# Patient Record
Sex: Female | Born: 1945 | Race: White | Hispanic: No | Marital: Married | Smoking: Never smoker
Health system: Southern US, Community
[De-identification: ages and names within clinical notes are randomized; demographics above are authoritative.]

## PROBLEM LIST (undated history)

## (undated) DIAGNOSIS — K5790 Diverticulosis of intestine, part unspecified, without perforation or abscess without bleeding: Secondary | ICD-10-CM

## (undated) DIAGNOSIS — F329 Major depressive disorder, single episode, unspecified: Secondary | ICD-10-CM

## (undated) DIAGNOSIS — K648 Other hemorrhoids: Secondary | ICD-10-CM

## (undated) DIAGNOSIS — F32A Depression, unspecified: Secondary | ICD-10-CM

## (undated) DIAGNOSIS — M1990 Unspecified osteoarthritis, unspecified site: Secondary | ICD-10-CM

## (undated) DIAGNOSIS — K635 Polyp of colon: Secondary | ICD-10-CM

## (undated) HISTORY — DX: Polyp of colon: K63.5

## (undated) HISTORY — PX: EYE SURGERY: SHX253

## (undated) HISTORY — DX: Other hemorrhoids: K64.8

## (undated) HISTORY — DX: Depression, unspecified: F32.A

## (undated) HISTORY — DX: Major depressive disorder, single episode, unspecified: F32.9

## (undated) HISTORY — DX: Unspecified osteoarthritis, unspecified site: M19.90

## (undated) HISTORY — DX: Diverticulosis of intestine, part unspecified, without perforation or abscess without bleeding: K57.90

## (undated) HISTORY — PX: BACK SURGERY: SHX140

---

## 1997-09-27 HISTORY — PX: TOTAL ABDOMINAL HYSTERECTOMY W/ BILATERAL SALPINGOOPHORECTOMY: SHX83

## 1997-10-17 ENCOUNTER — Inpatient Hospital Stay (HOSPITAL_COMMUNITY): Admission: RE | Admit: 1997-10-17 | Discharge: 1997-10-19 | Payer: Self-pay | Admitting: Gynecology

## 1999-01-17 ENCOUNTER — Encounter: Admission: RE | Admit: 1999-01-17 | Discharge: 1999-01-17 | Payer: Self-pay | Admitting: Gynecology

## 1999-01-17 ENCOUNTER — Encounter: Payer: Self-pay | Admitting: Gynecology

## 2000-01-24 ENCOUNTER — Encounter: Payer: Self-pay | Admitting: Gynecology

## 2000-01-24 ENCOUNTER — Encounter: Admission: RE | Admit: 2000-01-24 | Discharge: 2000-01-24 | Payer: Self-pay | Admitting: Gynecology

## 2000-08-24 ENCOUNTER — Other Ambulatory Visit: Admission: RE | Admit: 2000-08-24 | Discharge: 2000-08-24 | Payer: Self-pay | Admitting: Gynecology

## 2000-12-20 ENCOUNTER — Encounter: Payer: Self-pay | Admitting: Internal Medicine

## 2000-12-20 ENCOUNTER — Encounter: Admission: RE | Admit: 2000-12-20 | Discharge: 2000-12-20 | Payer: Self-pay | Admitting: Internal Medicine

## 2001-01-26 ENCOUNTER — Encounter: Admission: RE | Admit: 2001-01-26 | Discharge: 2001-01-26 | Payer: Self-pay | Admitting: Gynecology

## 2001-01-26 ENCOUNTER — Encounter: Payer: Self-pay | Admitting: Gynecology

## 2001-09-21 ENCOUNTER — Other Ambulatory Visit: Admission: RE | Admit: 2001-09-21 | Discharge: 2001-09-21 | Payer: Self-pay | Admitting: Gynecology

## 2002-01-31 ENCOUNTER — Encounter: Admission: RE | Admit: 2002-01-31 | Discharge: 2002-01-31 | Payer: Self-pay | Admitting: Gynecology

## 2002-01-31 ENCOUNTER — Encounter: Payer: Self-pay | Admitting: Gynecology

## 2002-06-01 ENCOUNTER — Encounter: Payer: Self-pay | Admitting: Internal Medicine

## 2002-09-26 ENCOUNTER — Other Ambulatory Visit: Admission: RE | Admit: 2002-09-26 | Discharge: 2002-09-26 | Payer: Self-pay | Admitting: Gynecology

## 2003-02-24 ENCOUNTER — Encounter: Admission: RE | Admit: 2003-02-24 | Discharge: 2003-02-24 | Payer: Self-pay | Admitting: Gynecology

## 2003-09-28 ENCOUNTER — Other Ambulatory Visit: Admission: RE | Admit: 2003-09-28 | Discharge: 2003-09-28 | Payer: Self-pay | Admitting: Gynecology

## 2003-11-20 ENCOUNTER — Encounter: Payer: Self-pay | Admitting: Internal Medicine

## 2004-02-29 ENCOUNTER — Encounter: Admission: RE | Admit: 2004-02-29 | Discharge: 2004-02-29 | Payer: Self-pay | Admitting: Gynecology

## 2004-05-29 ENCOUNTER — Ambulatory Visit: Payer: Self-pay | Admitting: Internal Medicine

## 2004-07-24 ENCOUNTER — Ambulatory Visit: Payer: Self-pay | Admitting: Internal Medicine

## 2004-07-29 ENCOUNTER — Encounter: Admission: RE | Admit: 2004-07-29 | Discharge: 2004-07-29 | Payer: Self-pay | Admitting: Internal Medicine

## 2004-10-07 ENCOUNTER — Other Ambulatory Visit: Admission: RE | Admit: 2004-10-07 | Discharge: 2004-10-07 | Payer: Self-pay | Admitting: Gynecology

## 2004-10-14 ENCOUNTER — Ambulatory Visit: Payer: Self-pay | Admitting: Internal Medicine

## 2004-10-21 ENCOUNTER — Ambulatory Visit: Payer: Self-pay | Admitting: Internal Medicine

## 2004-11-05 ENCOUNTER — Ambulatory Visit: Payer: Self-pay | Admitting: Internal Medicine

## 2005-03-11 ENCOUNTER — Encounter: Admission: RE | Admit: 2005-03-11 | Discharge: 2005-03-11 | Payer: Self-pay | Admitting: Internal Medicine

## 2005-05-26 ENCOUNTER — Encounter: Admission: RE | Admit: 2005-05-26 | Discharge: 2005-05-26 | Payer: Self-pay | Admitting: Neurological Surgery

## 2005-06-13 ENCOUNTER — Inpatient Hospital Stay (HOSPITAL_COMMUNITY): Admission: RE | Admit: 2005-06-13 | Discharge: 2005-06-16 | Payer: Self-pay | Admitting: Neurological Surgery

## 2005-07-22 ENCOUNTER — Encounter: Admission: RE | Admit: 2005-07-22 | Discharge: 2005-07-22 | Payer: Self-pay | Admitting: Neurological Surgery

## 2005-09-22 ENCOUNTER — Encounter: Admission: RE | Admit: 2005-09-22 | Discharge: 2005-09-22 | Payer: Self-pay | Admitting: Neurological Surgery

## 2005-10-30 ENCOUNTER — Other Ambulatory Visit: Admission: RE | Admit: 2005-10-30 | Discharge: 2005-10-30 | Payer: Self-pay | Admitting: Gynecology

## 2005-12-15 ENCOUNTER — Ambulatory Visit: Payer: Self-pay | Admitting: Internal Medicine

## 2005-12-15 DIAGNOSIS — F3289 Other specified depressive episodes: Secondary | ICD-10-CM | POA: Insufficient documentation

## 2005-12-15 DIAGNOSIS — M19049 Primary osteoarthritis, unspecified hand: Secondary | ICD-10-CM | POA: Insufficient documentation

## 2005-12-15 DIAGNOSIS — E785 Hyperlipidemia, unspecified: Secondary | ICD-10-CM

## 2005-12-15 DIAGNOSIS — F329 Major depressive disorder, single episode, unspecified: Secondary | ICD-10-CM

## 2005-12-15 LAB — CONVERTED CEMR LAB
ALT: 22 units/L (ref 0–40)
AST: 23 units/L (ref 0–37)
Albumin: 4 g/dL (ref 3.5–5.2)
Alkaline Phosphatase: 59 units/L (ref 39–117)
BUN: 13 mg/dL (ref 6–23)
Basophils Absolute: 0 10*3/uL (ref 0.0–0.1)
Basophils Relative: 0.5 % (ref 0.0–1.0)
Bilirubin, Direct: 0.1 mg/dL (ref 0.0–0.3)
CO2: 30 meq/L (ref 19–32)
Calcium: 9.6 mg/dL (ref 8.4–10.5)
Chloride: 104 meq/L (ref 96–112)
Chol/HDL Ratio, serum: 2.3
Cholesterol: 198 mg/dL (ref 0–200)
Creatinine, Ser: 0.9 mg/dL (ref 0.4–1.2)
Eosinophil percent: 5.8 % — ABNORMAL HIGH (ref 0.0–5.0)
GFR calc non Af Amer: 69 mL/min
Glomerular Filtration Rate, Af Am: 83 mL/min/{1.73_m2}
Glucose, Bld: 84 mg/dL (ref 70–99)
HCT: 42 % (ref 36.0–46.0)
HDL: 84.4 mg/dL (ref >39.0)
Hemoglobin: 14.2 g/dL (ref 12.0–15.0)
LDL Cholesterol: 105 mg/dL — ABNORMAL HIGH (ref 0–99)
Lymphocytes Relative: 36.6 % (ref 12.0–46.0)
MCHC: 33.8 g/dL (ref 30.0–36.0)
MCV: 92.5 fL (ref 78.0–100.0)
Monocytes Absolute: 0.7 10*3/uL (ref 0.2–0.7)
Monocytes Relative: 15.1 % — ABNORMAL HIGH (ref 3.0–11.0)
Neutro Abs: 2 10*3/uL (ref 1.4–7.7)
Neutrophils Relative %: 42 % — ABNORMAL LOW (ref 43.0–77.0)
Platelets: 257 10*3/uL (ref 150–400)
Potassium: 4.3 meq/L (ref 3.5–5.1)
RBC: 4.54 M/uL (ref 3.87–5.11)
RDW: 12.7 % (ref 11.5–14.6)
Sodium: 143 meq/L (ref 135–145)
Total Bilirubin: 0.6 mg/dL (ref 0.3–1.2)
Total Protein: 7 g/dL (ref 6.0–8.3)
Triglyceride fasting, serum: 41 mg/dL (ref 0–149)
VLDL: 8 mg/dL (ref 0–40)
WBC: 4.7 10*3/uL (ref 4.5–10.5)

## 2005-12-22 ENCOUNTER — Encounter: Admission: RE | Admit: 2005-12-22 | Discharge: 2005-12-22 | Payer: Self-pay | Admitting: Neurological Surgery

## 2006-04-03 ENCOUNTER — Encounter: Payer: Self-pay | Admitting: Internal Medicine

## 2006-04-03 ENCOUNTER — Encounter: Admission: RE | Admit: 2006-04-03 | Discharge: 2006-04-03 | Payer: Self-pay | Admitting: Gynecology

## 2006-05-21 ENCOUNTER — Ambulatory Visit: Payer: Self-pay | Admitting: Internal Medicine

## 2006-10-30 DIAGNOSIS — M1990 Unspecified osteoarthritis, unspecified site: Secondary | ICD-10-CM | POA: Insufficient documentation

## 2007-05-06 ENCOUNTER — Encounter: Admission: RE | Admit: 2007-05-06 | Discharge: 2007-05-06 | Payer: Self-pay | Admitting: Gynecology

## 2007-06-01 ENCOUNTER — Telehealth: Payer: Self-pay | Admitting: Internal Medicine

## 2007-06-01 ENCOUNTER — Ambulatory Visit: Payer: Self-pay | Admitting: Internal Medicine

## 2007-09-30 ENCOUNTER — Ambulatory Visit: Payer: Self-pay | Admitting: Internal Medicine

## 2007-09-30 LAB — CONVERTED CEMR LAB
AST: 24 units/L (ref 0–37)
Albumin: 3.8 g/dL (ref 3.5–5.2)
Alkaline Phosphatase: 58 units/L (ref 39–117)
BUN: 12 mg/dL (ref 6–23)
Bilirubin, Direct: 0.1 mg/dL (ref 0.0–0.3)
Blood in Urine, dipstick: NEGATIVE
CO2: 31 meq/L (ref 19–32)
Chloride: 104 meq/L (ref 96–112)
Eosinophils Relative: 3.2 % (ref 0.0–5.0)
Glucose, Bld: 89 mg/dL (ref 70–99)
HCT: 39.3 % (ref 36.0–46.0)
HDL: 81.7 mg/dL (ref >39.0)
LDL Cholesterol: 99 mg/dL (ref 0–99)
Lymphocytes Relative: 35.1 % (ref 12.0–46.0)
Monocytes Absolute: 0.6 10*3/uL (ref 0.1–1.0)
Monocytes Relative: 13.4 % — ABNORMAL HIGH (ref 3.0–12.0)
Neutrophils Relative %: 47.2 % (ref 43.0–77.0)
Nitrite: NEGATIVE
Platelets: 252 10*3/uL (ref 150–400)
Potassium: 3.7 meq/L (ref 3.5–5.1)
Specific Gravity, Urine: 1.01
Total CHOL/HDL Ratio: 2.3
Total Protein: 7 g/dL (ref 6.0–8.3)
Urobilinogen, UA: 0.2
WBC Urine, dipstick: NEGATIVE
WBC: 4.7 10*3/uL (ref 4.5–10.5)

## 2007-10-08 ENCOUNTER — Telehealth: Payer: Self-pay | Admitting: Internal Medicine

## 2007-11-18 ENCOUNTER — Ambulatory Visit: Payer: Self-pay | Admitting: Internal Medicine

## 2007-11-18 LAB — HM COLONOSCOPY

## 2007-11-19 ENCOUNTER — Telehealth: Payer: Self-pay | Admitting: Internal Medicine

## 2008-06-09 ENCOUNTER — Encounter: Admission: RE | Admit: 2008-06-09 | Discharge: 2008-06-09 | Payer: Self-pay | Admitting: Internal Medicine

## 2008-11-24 ENCOUNTER — Telehealth: Payer: Self-pay | Admitting: Internal Medicine

## 2008-12-28 ENCOUNTER — Ambulatory Visit: Payer: Self-pay | Admitting: Internal Medicine

## 2008-12-28 LAB — CONVERTED CEMR LAB
ALT: 22 units/L (ref 0–35)
AST: 23 units/L (ref 0–37)
Alkaline Phosphatase: 58 units/L (ref 39–117)
BUN: 11 mg/dL (ref 6–23)
Basophils Relative: 1.2 % (ref 0.0–3.0)
Bilirubin, Direct: 0.1 mg/dL (ref 0.0–0.3)
Chloride: 104 meq/L (ref 96–112)
Cholesterol: 218 mg/dL — ABNORMAL HIGH (ref 0–200)
Creatinine, Ser: 0.8 mg/dL (ref 0.4–1.2)
Eosinophils Relative: 4.4 % (ref 0.0–5.0)
GFR calc non Af Amer: 77.51 mL/min (ref >60)
HDL: 87.6 mg/dL (ref >39.00)
MCV: 95.8 fL (ref 78.0–100.0)
Monocytes Absolute: 0.6 10*3/uL (ref 0.1–1.0)
Monocytes Relative: 12.4 % — ABNORMAL HIGH (ref 3.0–12.0)
Neutrophils Relative %: 44.5 % (ref 43.0–77.0)
Platelets: 226 10*3/uL (ref 150.0–400.0)
Potassium: 4 meq/L (ref 3.5–5.1)
Protein, U semiquant: NEGATIVE
RBC: 4.51 M/uL (ref 3.87–5.11)
Total Bilirubin: 0.8 mg/dL (ref 0.3–1.2)
Total CHOL/HDL Ratio: 2
Total Protein: 7.7 g/dL (ref 6.0–8.3)
Urobilinogen, UA: 0.2
VLDL: 17.4 mg/dL (ref 0.0–40.0)
WBC Urine, dipstick: NEGATIVE
WBC: 4.9 10*3/uL (ref 4.5–10.5)

## 2008-12-29 ENCOUNTER — Telehealth: Payer: Self-pay | Admitting: Internal Medicine

## 2009-01-03 ENCOUNTER — Ambulatory Visit: Payer: Self-pay | Admitting: Internal Medicine

## 2009-07-03 ENCOUNTER — Encounter: Admission: RE | Admit: 2009-07-03 | Discharge: 2009-07-03 | Payer: Self-pay | Admitting: Gynecology

## 2009-07-03 LAB — HM MAMMOGRAPHY

## 2009-11-27 ENCOUNTER — Encounter: Payer: Self-pay | Admitting: Internal Medicine

## 2009-12-26 ENCOUNTER — Encounter: Admission: RE | Admit: 2009-12-26 | Discharge: 2009-12-26 | Payer: Self-pay | Admitting: Neurological Surgery

## 2010-01-02 ENCOUNTER — Ambulatory Visit: Payer: Self-pay | Admitting: Internal Medicine

## 2010-01-02 LAB — CONVERTED CEMR LAB
ALT: 24 units/L (ref 0–35)
AST: 26 units/L (ref 0–37)
Alkaline Phosphatase: 58 units/L (ref 39–117)
Basophils Absolute: 0 10*3/uL (ref 0.0–0.1)
Blood in Urine, dipstick: NEGATIVE
Calcium: 9.2 mg/dL (ref 8.4–10.5)
Eosinophils Relative: 5.1 % — ABNORMAL HIGH (ref 0.0–5.0)
GFR calc non Af Amer: 91.63 mL/min (ref >60.00)
Glucose, Bld: 83 mg/dL (ref 70–99)
HDL: 89.7 mg/dL (ref >39.00)
Hemoglobin: 12.9 g/dL (ref 12.0–15.0)
Ketones, urine, test strip: NEGATIVE
Lymphocytes Relative: 33 % (ref 12.0–46.0)
Monocytes Relative: 11.5 % (ref 3.0–12.0)
Neutro Abs: 2.5 10*3/uL (ref 1.4–7.7)
Nitrite: NEGATIVE
Platelets: 231 10*3/uL (ref 150.0–400.0)
Potassium: 4.2 meq/L (ref 3.5–5.1)
Protein, U semiquant: NEGATIVE
RDW: 13.8 % (ref 11.5–14.6)
Sodium: 140 meq/L (ref 135–145)
Specific Gravity, Urine: 1.015
Total Bilirubin: 0.5 mg/dL (ref 0.3–1.2)
Total CHOL/HDL Ratio: 2
Triglycerides: 45 mg/dL (ref 0.0–149.0)
Urobilinogen, UA: 0.2
VLDL: 9 mg/dL (ref 0.0–40.0)
WBC: 5.1 10*3/uL (ref 4.5–10.5)

## 2010-01-16 ENCOUNTER — Ambulatory Visit: Payer: Self-pay | Admitting: Internal Medicine

## 2010-01-16 ENCOUNTER — Encounter: Payer: Self-pay | Admitting: Internal Medicine

## 2010-01-27 DIAGNOSIS — K635 Polyp of colon: Secondary | ICD-10-CM

## 2010-01-27 DIAGNOSIS — K648 Other hemorrhoids: Secondary | ICD-10-CM

## 2010-01-27 DIAGNOSIS — K5790 Diverticulosis of intestine, part unspecified, without perforation or abscess without bleeding: Secondary | ICD-10-CM

## 2010-01-27 HISTORY — DX: Polyp of colon: K63.5

## 2010-01-27 HISTORY — DX: Diverticulosis of intestine, part unspecified, without perforation or abscess without bleeding: K57.90

## 2010-01-27 HISTORY — DX: Other hemorrhoids: K64.8

## 2010-02-13 LAB — CBC
HCT: 41.7 % (ref 36.0–46.0)
Hemoglobin: 13.6 g/dL (ref 12.0–15.0)
MCH: 29.5 pg (ref 26.0–34.0)
MCHC: 32.6 g/dL (ref 30.0–36.0)
MCV: 90.5 fL (ref 78.0–100.0)
Platelets: 258 10*3/uL (ref 150–400)
RBC: 4.61 MIL/uL (ref 3.87–5.11)
RDW: 13.3 % (ref 11.5–15.5)
WBC: 6.2 10*3/uL (ref 4.0–10.5)

## 2010-02-13 LAB — DIFFERENTIAL
Basophils Absolute: 0.1 10*3/uL (ref 0.0–0.1)
Basophils Relative: 1 % (ref 0–1)
Eosinophils Absolute: 0.3 10*3/uL (ref 0.0–0.7)
Eosinophils Relative: 5 % (ref 0–5)
Lymphocytes Relative: 39 % (ref 12–46)
Lymphs Abs: 2.4 10*3/uL (ref 0.7–4.0)
Monocytes Absolute: 0.8 10*3/uL (ref 0.1–1.0)
Monocytes Relative: 13 % — ABNORMAL HIGH (ref 3–12)
Neutro Abs: 2.6 10*3/uL (ref 1.7–7.7)
Neutrophils Relative %: 42 % — ABNORMAL LOW (ref 43–77)

## 2010-02-13 LAB — SURGICAL PCR SCREEN
MRSA, PCR: NEGATIVE
Staphylococcus aureus: NEGATIVE

## 2010-02-13 LAB — BASIC METABOLIC PANEL
BUN: 13 mg/dL (ref 6–23)
CO2: 28 mEq/L (ref 19–32)
Calcium: 9.6 mg/dL (ref 8.4–10.5)
Chloride: 105 mEq/L (ref 96–112)
Creatinine, Ser: 0.81 mg/dL (ref 0.4–1.2)
GFR calc Af Amer: >60 mL/min (ref >60)
GFR calc non Af Amer: >60 mL/min (ref >60)
Glucose, Bld: 89 mg/dL (ref 70–99)
Potassium: 4 mEq/L (ref 3.5–5.1)
Sodium: 141 mEq/L (ref 135–145)

## 2010-02-13 LAB — TYPE AND SCREEN
ABO/RH(D): A POS
Antibody Screen: NEGATIVE

## 2010-02-13 LAB — PROTIME-INR
INR: 0.99 (ref 0.00–1.49)
Prothrombin Time: 13.3 seconds (ref 11.6–15.2)

## 2010-02-13 LAB — APTT: aPTT: 34 seconds (ref 24–37)

## 2010-02-14 ENCOUNTER — Inpatient Hospital Stay (HOSPITAL_COMMUNITY)
Admission: RE | Admit: 2010-02-14 | Discharge: 2010-02-17 | Payer: Self-pay | Source: Home / Self Care | Attending: Neurological Surgery | Admitting: Neurological Surgery

## 2010-02-22 NOTE — Op Note (Addendum)
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  PROCEDURES: 1. Lumbar re-exploration with exploration of fusion at L4-5, removal     of hardware at L4-5. 2. Decompressive lumbar laminectomy, hemifacetectomy, and foraminotomy     at L3-4 in a Gill type fashion requiring much more work than is     required of a simple plus procedure. 3. Intertransverse arthrodesis at L3-4 utilizing local autograft and     Actifuse putty. 4. Posterior lumbar interbody fusion at L3-4 utilizing a 10 x 8 mm     Tangent interbody bone wedge and then 8-mm PEEK interbody cage     packed with local autograft and Actifuse putty. 5. Nonsegmental fixation at L3-4 utilizing the Legacy pedicle screw     system.  SURGEON:  Tia Alert, MD  ASSISTANT:  Reinaldo Meeker, MD  ANESTHESIA:  General endotracheal.  COMPLICATIONS:  None apparent.  INDICATION FOR THE PROCEDURE:  Maria Lloyd is a 65 year old female who underwent posterior lumbar interbody fusion at L4-5 several years ago and did quite well.  She returned with back and bilateral leg pain.  She had an MRI and plain films which showed a solid fusion at L3-4 but significant degenerative breakdown at L3-4 with spondylolisthesis, kyphosis, and severe stenosis.  I recommended a lumbar re-exploration with a posterior lumbar interbody fusion at L3-4.  She understood  the risks, benefits, and expected outcome and wished to proceed.  DESCRIPTION OF THE PROCEDURE:  The patient was taken to operating room. After induction of adequate generalized endotracheal anesthesia, she was rolled in the prone position on the chest rolls and all pressure points were padded.  Her lumbar region was prepped with DuraPrep and then draped in usual sterile fashion.  A 10 mL of local anesthesia was injected and a dorsal midline incision made and carried down to the lumbosacral fascia.  The fascia was opened, the paraspinous musculature was taken down in a subperiosteal fashion to expose L3-4 and L4-5, exposed the transverse processes of L3 and L4.  I exposed the old pedicle screws and locking caps and rods at L4-5.  I removed the locking caps and the rods and then removed the L5 pedicle screws.  The L4 pedicle screws were tested and felt to be solid in the bone.  I then removed the spinous process of L3 and then utilizing the high-speed drill and Kerrison punches, I performed a complete laminectomy, hemifacetectomy, and foraminotomy at L3-4.  The underlying yellow ligament was opened and removed.  The L3 and L4 nerve  roots were decompressed distally into their respective foramina.  Wide foraminotomies were performed.  It was decompressed until I could pass coronary dilator easily along the L3 and L4 nerve roots.  I then found the pedicle screw entry zones at L3 utilizing AP and lateral fluoroscopy, probed each pedicle with a pedicle probe, tapped each pedicle with a 5-5 tap, and placed 65 x 45 mm pedicle screws into the L3 pedicles bilaterally.  I then coagulated the epidural venous vasculature and cut the disk space sharply and distracted the disk space up to 8 mm and then used rotating cutter and cutting chisel bilaterally to prepare the endplates.  The midline was prepared with an Epstein curette.  We then placed an 8-mm PEEK interbody cage packed with local autograft  and Actifuse putty on the left and a Tangent interbody bone wedge on the right.  The midline was packed with local autograft and Actifuse putty. The intertransverse space was decorticated and local autograft and Actifuse putty was placed here to perform intertransverse arthrodesis bilaterally.  I then placed lordotic rods into multiaxial screw heads of the pedicle screws and locked these into position with the locking caps and anti-torque device.  I then irrigated with saline solution containing bacitracin, dried all bleeding points, inspected nerve roots once again, lined the dura with Gelfoam, and then placed a medium Hemovac drain through a separate stab incision and then closed the muscle and the fascia with 0 Vicryl, closed the subcutaneous with subcuticular tissue with 2-0 and 3-0 Vicryl and closed the skin with Benzoin and  Steri-Strips.  The drapes were removed.  Sterile dressing was applied.  The patient was awakened from general anesthesia and transferred to recovery room in stable condition.  At the end of the procedure, all sponge, needle, and instrument counts were correct.     Tia Alert, MD     DSJ/MEDQ  D:  02/14/2010  T:  02/15/2010  Job:  [id number]  Electronically Signed by Marikay Alar MD on 02/22/2010 08:41:30 AM

## 2010-02-28 NOTE — Assessment & Plan Note (Signed)
Summary: cpx/njr/pt rsc from bmp/cjr   Vital Signs:  Patient profile:   65 year old female Menstrual status:  postmenopausal Height:      63.5 inches Weight:      157 pounds BMI:     27.47 Temp:     98.0 degrees F oral Pulse rate:   76 / minute Pulse rhythm:   regular BP sitting:   114 / 88  (left arm) Cuff size:   regular  Vitals Entered By: Alfred Levins, CMA (January 16, 2010 8:17 AM) CC: cpx, no pap   CC:  cpx and no pap.  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 Take 1 Tablet By Mouth Twice A Day 2)  Budeprion Xl 300 Mg Tb24 (Bupropion Hcl) .... Take 1 Tablet By Mouth Once A Day 3)  Vivelle-Dot 0.0375 Mg/24hr  Pttw (Estradiol) .... Change Twice Weekly 4)  Estrace 0.1 Mg/gm Crea (Estradiol) .... Use As Directed  Allergies (verified): No Known Drug Allergies  Family History:  Mother deceased with stroke, lung CA Family History of Prostate CA 1st degree relative father Brother -multiple medical issues deceased age 64  Physical Exam  General:  Well-developed,well-nourished,in no acute distress; alert,appropriate and cooperative throughout examination Head:  normocephalic and atraumatic.   Eyes:  pupils equal and pupils round.   Neck:  No deformities, masses, or tenderness noted. Chest Wall:  No deformities, masses, or tenderness noted. Lungs:  Normal respiratory effort, chest expands symmetrically. Lungs are clear to auscultation, no crackles or wheezes. Heart:  Normal rate and regular rhythm. S1 and S2 normal without gallop, murmur, click, rub or other extra sounds. Abdomen:  Bowel sounds positive,abdomen soft and non-tender without masses,  organomegaly or hernias noted. Skin:  Intact without suspicious lesions or rashes Psych:  memory intact for recent and remote and normally interactive.     Impression & Recommendations:  Problem # 1:  PREVENTIVE HEALTH CARE (ICD-V70.0) health maint UTD she will consider zostavax  Problem # 2:  HYPERLIPIDEMIA NEC/NOS (ICD-272.4) see hdl Labs Reviewed: SGOT: 26 (01/02/2010)   SGPT: 24 (01/02/2010)   HDL:89.70 (01/02/2010), 87.60 (12/28/2008)  LDL:99 (09/30/2007), 105 (16/10/9602)  Chol:206 (01/02/2010), 218 (12/28/2008)  Trig:45.0 (01/02/2010), 87.0 (12/28/2008)  Complete Medication List: 1)  Etodolac 400 Mg Tabs (Etodolac) .... Take 1 tablet by mouth twice a day 2)  Budeprion Xl 300 Mg Tb24 (Bupropion hcl) .... Take 1 tablet by mouth once a day 3)  Vivelle-dot 0.0375 Mg/24hr Pttw (Estradiol) .... Change twice weekly 4)  Estrace 0.1 Mg/gm Crea (Estradiol) .... Use as directed   Orders Added: 1)  Est. Patient 40-64 years [99396]    Preventive Care Screening  Bone Density:    Date:  11/27/2009    Next Due:  11/2011    Results:  normal std dev  Mammogram:    Date:  01/27/2009    Next Due:  06/2010    Results:  normal   Pap Smear:    Date:  11/27/2009    Next Due:  11/2010    Results:  normal

## 2010-03-18 ENCOUNTER — Ambulatory Visit
Admission: RE | Admit: 2010-03-18 | Discharge: 2010-03-18 | Disposition: A | Discharge status: Home / Self Care | Payer: Managed Care, Other (non HMO) | Source: Ambulatory Visit | Attending: Neurological Surgery | Admitting: Neurological Surgery

## 2010-03-18 ENCOUNTER — Other Ambulatory Visit: Payer: Self-pay | Admitting: Neurological Surgery

## 2010-03-18 DIAGNOSIS — Z09 Encounter for follow-up examination after completed treatment for conditions other than malignant neoplasm: Secondary | ICD-10-CM

## 2010-05-14 ENCOUNTER — Telehealth: Payer: Self-pay | Admitting: *Deleted

## 2010-05-14 DIAGNOSIS — F329 Major depressive disorder, single episode, unspecified: Secondary | ICD-10-CM

## 2010-05-14 MED ORDER — BUPROPION HCL ER (XL) 300 MG PO TB24: 300.0000 mg | ORAL_TABLET | ORAL | Status: DC

## 2010-05-14 NOTE — Telephone Encounter (Signed)
Bupropion XL 300mg  sent to [facility]

## 2010-05-14 NOTE — Telephone Encounter (Signed)
rx sent in electronically 

## 2010-05-21 ENCOUNTER — Other Ambulatory Visit: Payer: Self-pay | Admitting: Neurological Surgery

## 2010-05-21 ENCOUNTER — Ambulatory Visit
Admission: RE | Admit: 2010-05-21 | Discharge: 2010-05-21 | Disposition: A | Discharge status: Home / Self Care | Payer: Managed Care, Other (non HMO) | Source: Ambulatory Visit | Attending: Neurological Surgery | Admitting: Neurological Surgery

## 2010-05-21 DIAGNOSIS — M48061 Spinal stenosis, lumbar region without neurogenic claudication: Secondary | ICD-10-CM

## 2010-05-21 DIAGNOSIS — M4310 Spondylolisthesis, site unspecified: Secondary | ICD-10-CM

## 2010-05-21 DIAGNOSIS — M963 Postlaminectomy kyphosis: Secondary | ICD-10-CM

## 2010-05-21 DIAGNOSIS — M79609 Pain in unspecified limb: Secondary | ICD-10-CM

## 2010-06-14 NOTE — Op Note (Signed)
Maria Lloyd, Maria Lloyd             ACCOUNT NO.:  [id number]   MEDICAL RECORD NO.:  [id number]          PATIENT TYPE:  INP   LOCATION:  3012                         FACILITY:  [facility]   PHYSICIAN:  Tia Alert, MD     DATE OF BIRTH:  May 25, 1945   DATE OF PROCEDURE:  06/13/2005  DATE OF DISCHARGE:                                 OPERATIVE REPORT   PREOPERATIVE DIAGNOSIS:  Lumbar grade 2 spondylolisthesis L4-L5 with spinal  stenosis, back and leg pain.   POSTOPERATIVE DIAGNOSIS:  Lumbar grade 2 spondylolisthesis L4-L5 with spinal  stenosis, back and leg pain.   PROCEDURE:  1.  Decompressive lumbar Gill procedure L4-L5 for central canal nerve root      decompression requiring more work than is usually required for simple      posterior lumbar interbody fusion procedure.  2.  Posterior lumbar interbody fusion L4-L5 utilizing the 10 x 22 mm PEEK      interbody cage packed with local autograft and Osteocel and a Tangent      interbody bone wedge.  3.  Intertransverse arthrodesis L4-L5 utilizing local autograft mixed with      Osteocel.  4.  Nonsegmental fixation L4-L5 utilizing the Legacy Pedicle Screw System.   SURGEON:  Tia Alert, M.D.   ASSISTANT:  Kathaleen Maser. Pool, M.D.   ANESTHESIA:  General endotracheal anesthesia.   COMPLICATIONS:  None apparent.   INDICATIONS FOR PROCEDURE:  Maria Lloyd is a 65 year old white female who is  referred with significant back pain with right leg pain and some numbness  and tingling. She had an MRI and plain films with flexion-extension views  which showed segmental instability with a grade 2 spondylolisthesis at L4-  L5.  This was felt to be degenerative in nature.  He had spinal stenosis  related to this and a pseudo-disk herniation with degenerative disease.  She  complained of significant back pain and leg pain which had failed to respond  to medical management.  I recommended a lumbar instrumented fusion.  She  understood the risks,  benefits, and expected outcome and wished to proceed.   DESCRIPTION OF PROCEDURE:  The patient was taken to the operating room and  after induction of adequate generalized endotracheal anesthesia, she was  rolled in the prone position on chest rolls and all pressure points were  padded.  Her lumbar region was prepped with DuraPrep and draped in the usual  sterile fashion.  8 mL of local anesthesia was injected and a small dorsal  midline incision was made and carried down to the lumbosacral fascia.  The  fascia was opened and the paraspinous musculature was taken down in  subperiosteal fashion to expose L4-L5.  Interoperative fluoroscopy confirmed  my level and then I carried the dissection out over the facettes which were  quite overgrown and spondylotic.  I identified the transverse processes of  L4 and L5 bilaterally and placed a self-retaining retractor.  I then used  the Leksell rongeur, Kerrison punch, and high speed drill to perform a  complete laminectomy, hemifacetectomy, and foraminotomies at L4-L5. The L4  and L5 nerve roots were identified and decompressed distally into the  foramina dissecting along the pedicle wall. Again, bilateral foraminotomies  were performed.  I coagulated the epidural venous vasculature and cut this  sharply and incised the disk space bilaterally.  I used sequential  distraction to not only distract the disk space but to help reduce her  spondylolisthesis.  We distracted up to 10 mm and then used a combination of  rotating cutter, curets, pituitary rongeur, and cutting chisel to prepare  the endplates on the patient's left side for arthrodesis.  We packed a 10 x  22 mm PEEK interbody cage with local autograft and Osteocel and tapped this  into position at L4-L5.  We then prepared the endplates on the patient's  right side in the same way utilizing the rotating cutter, round scraper,  cutting chisel and then packed the midline with local autograft mixed   Osteocel and then tapped a 10 x 22 mm Tangent interbody bone wedge into the  interspace at L4-L5 on the right.  Once our PLIF was completed, we turned  our attention to the nonsegmental fixation.  We localized the pedicle screw  entry zones using surface landmarks an interoperative fluoroscopy.  We  probed each pedicle with the pedicle probe, tapped each pedicle with a 5//5  tap, palpated each pedicle with a ball probe to assure no break in the  cortex, and placed 6.5 x 45 mm pedicle screws into the pedicles of L4 and L5  bilaterally.  We then decorticated the transverse processes and packed a  layer of local autograft mixed with Osteocel out over this for  intertransverse arthrodesis bilaterally.  We then took two 40 mm lordotic  rods and placed these into the multiaxial screw heads of the pedicle screws  and locked these into position with the locking caps and anti-torque device  after achieving compression on the grafts.  We irrigated with copious  amounts bacitracin containing saline solution, dried all bleeding points  with bipolar cautery, inspected the nerve roots once again with a coronary  dilator to assure adequate decompression of the nerve roots, lined the dura  with Gelfoam, placed a medium Hemovac drain through a separate stab  incision, and then closed the muscle and fascia with interrupted #1 Vicryl,  closed the subcutaneous and subcuticular tissues with 2-0 and 3-0 Vicryl,  and closed the skin with Benzoin and Steri-Strips.  The drapes removed.  A  sterile dressing was applied.  The patient was awakened from general  anesthesia and transferred to the recovery room in stable condition.  At the  end of the procedure, all sponge, needle and instrument counts were correct.      Tia Alert, MD  Electronically Signed     DSJ/MEDQ  D:  06/13/2005  T:  06/13/2005  Job:  [id number]

## 2010-06-14 NOTE — Discharge Summary (Signed)
Maria Lloyd, Maria Lloyd             ACCOUNT NO.:  [id number]   MEDICAL RECORD NO.:  [id number]          PATIENT TYPE:  INP   LOCATION:  3012                         FACILITY:  [facility]   PHYSICIAN:  Tia Alert, MD     DATE OF BIRTH:  12-Feb-1945   DATE OF ADMISSION:  06/13/2005  DATE OF DISCHARGE:  06/16/2005                                 DISCHARGE SUMMARY   ADMISSION DIAGNOSIS:  Lumbar spondylolisthesis, L4-5.   PROCEDURE:  Posterior lumbar interbody fusion with nonsegmental  instrumentation, L4-5.   HISTORY OF PRESENT ILLNESS:  Maria Lloyd is a 65 year old white female who  presented with back pain and right leg pain.  She had plain films with  flexion and extension views which showed segmental instability at L4-5.  She  had an MRI which showed significant lateral recess and foraminal stenosis  related to a grade 2 spondylolisthesis at L4-5.  She tried medical  management for quite some time without significant relief and had  progressively worsening symptoms.  I recommended a lumbar decompression and  instrumented fusion at L4-5.  She understood the risks, benefits,  alternatives, and suspected outcome and wished to proceed.   HOSPITAL COURSE:  The patient was admitted on Jun 13, 2005 and taken to the  operating room, where she underwent a PLIF at L4-5.  The patient tolerated  the procedure well, was taken to the recovery room and then to the floor in  stable condition.  For details of the operative procedure, please see the  dictated operative note.  The patient's hospital course was routine.  There  were no complications.  She remained afebrile with stable vital signs.  Her  wound remained clean, dry, and intact.  She had her Foley catheter  discontinued, and she was able to ambulate to the bathroom without  difficulty.  Her diet was advanced to a regular diet, and she tolerated this  well.  Her pain was well controlled on a Dilaudid PCA for the first two  days, and this was  removed.  She did well with Percocet and Valium.  She  continued to ambulate well, even without a walker, to the point that she was  ambulating in the halls on Jun 16, 2005.  She was doing extremely well.  Was  eager for discharge to home.  She will be discharged to home with plans to  follow up with me in two weeks.   DISCHARGE MEDICATIONS:  Percocet and Flexeril.   Return appointment in two weeks.   FINAL DIAGNOSIS:  Posterior lumbar interbody fusion, L4-5, with nonsegmental  instrumentation.      Tia Alert, MD  Electronically Signed     DSJ/MEDQ  D:  06/16/2005  T:  06/16/2005  Job:  [id number]

## 2010-06-19 ENCOUNTER — Other Ambulatory Visit: Payer: Self-pay | Admitting: Internal Medicine

## 2010-06-19 DIAGNOSIS — Z1231 Encounter for screening mammogram for malignant neoplasm of breast: Secondary | ICD-10-CM

## 2010-07-01 ENCOUNTER — Ambulatory Visit (INDEPENDENT_AMBULATORY_CARE_PROVIDER_SITE_OTHER): Payer: Managed Care, Other (non HMO) | Admitting: Internal Medicine

## 2010-07-01 ENCOUNTER — Encounter: Payer: Self-pay | Admitting: Internal Medicine

## 2010-07-01 DIAGNOSIS — L237 Allergic contact dermatitis due to plants, except food: Secondary | ICD-10-CM | POA: Insufficient documentation

## 2010-07-01 DIAGNOSIS — L255 Unspecified contact dermatitis due to plants, except food: Secondary | ICD-10-CM

## 2010-07-01 MED ORDER — METHYLPREDNISOLONE (PAK) 4 MG PO TABS: ORAL_TABLET | ORAL | Status: AC

## 2010-07-01 MED ORDER — TRIAMCINOLONE ACETONIDE 0.025 % EX OINT: TOPICAL_OINTMENT | Freq: Two times a day (BID) | CUTANEOUS | Status: DC

## 2010-07-01 MED ORDER — METHYLPREDNISOLONE ACETATE 80 MG/ML IJ SUSP
40.0000 mg | Freq: Once | INTRAMUSCULAR | Status: AC
  Administered 2010-07-01: 40 mg via INTRAMUSCULAR

## 2010-07-01 NOTE — Assessment & Plan Note (Signed)
Depomedrol 40mg  IM given. Begin medrol dosepak and triamcinolone cream bid.

## 2010-07-01 NOTE — Progress Notes (Signed)
  Subjective:    Patient ID: Maria Lloyd, female    DOB: [birth year], 65 y.o.   MRN: [id number]  HPI Pt presents to clinic for evaluation of poison ivy. Notes over 1wk h/o itchy rash involving primarily arms. No obvious secondary infection. No pain or dermatomal distribution. Attempting calamine and otc hydrocortisone without significant improvement. No other alleviating or exacerbating factors. No oral or ocular involvement. No other complaint.  Reviewed pmh, medications and allergies.    Review of Systems see hpi     Objective:   Physical Exam  Nursing note and vitals reviewed. Constitutional: She appears well-developed and well-nourished. No distress.  HENT:  Head: Normocephalic and atraumatic.  Right Ear: External ear normal.  Left Ear: External ear normal.  Nose: Nose normal.  Skin: Skin is warm and dry. She is not diaphoretic.       Erythematous vesicular papular rash along arms.          Assessment & Plan:

## 2010-07-05 ENCOUNTER — Ambulatory Visit: Payer: Managed Care, Other (non HMO)

## 2010-07-12 ENCOUNTER — Telehealth: Payer: Self-pay | Admitting: *Deleted

## 2010-07-12 ENCOUNTER — Ambulatory Visit: Payer: Managed Care, Other (non HMO)

## 2010-07-12 NOTE — Telephone Encounter (Signed)
Pt given Dr. Ty Hilts recommendations.

## 2010-07-12 NOTE — Telephone Encounter (Signed)
Pt's poison ivy is still spreading after all the treatment she received this week.  Asking for advice.

## 2010-07-12 NOTE — Telephone Encounter (Signed)
Received steroid injxn, steroid cream and medrol dosepak. Recommend giving it more time

## 2010-07-18 ENCOUNTER — Ambulatory Visit
Admission: RE | Admit: 2010-07-18 | Discharge: 2010-07-18 | Disposition: A | Discharge status: Home / Self Care | Payer: Managed Care, Other (non HMO) | Source: Ambulatory Visit | Attending: Internal Medicine | Admitting: Internal Medicine

## 2010-07-18 DIAGNOSIS — Z1231 Encounter for screening mammogram for malignant neoplasm of breast: Secondary | ICD-10-CM

## 2010-07-23 ENCOUNTER — Other Ambulatory Visit: Payer: Self-pay | Admitting: Neurological Surgery

## 2010-07-23 ENCOUNTER — Ambulatory Visit
Admission: RE | Admit: 2010-07-23 | Discharge: 2010-07-23 | Disposition: A | Discharge status: Home / Self Care | Payer: Managed Care, Other (non HMO) | Source: Ambulatory Visit | Attending: Neurological Surgery | Admitting: Neurological Surgery

## 2010-07-23 DIAGNOSIS — M545 Low back pain: Secondary | ICD-10-CM

## 2010-08-13 ENCOUNTER — Telehealth: Payer: Self-pay | Admitting: *Deleted

## 2010-08-13 MED ORDER — ETODOLAC 400 MG PO TABS: 400.0000 mg | ORAL_TABLET | Freq: Two times a day (BID) | ORAL | Status: DC

## 2010-08-13 NOTE — Telephone Encounter (Signed)
rx sent in 

## 2010-08-13 NOTE — Telephone Encounter (Signed)
Pt requesting refill of etodolace 400mg  [facility]

## 2010-11-05 ENCOUNTER — Ambulatory Visit
Admission: RE | Admit: 2010-11-05 | Discharge: 2010-11-05 | Disposition: A | Discharge status: Home / Self Care | Payer: Managed Care, Other (non HMO) | Source: Ambulatory Visit | Attending: Neurological Surgery | Admitting: Neurological Surgery

## 2010-11-05 ENCOUNTER — Other Ambulatory Visit: Payer: Self-pay | Admitting: Neurological Surgery

## 2010-11-05 DIAGNOSIS — M79609 Pain in unspecified limb: Secondary | ICD-10-CM

## 2010-11-05 DIAGNOSIS — M48061 Spinal stenosis, lumbar region without neurogenic claudication: Secondary | ICD-10-CM

## 2010-11-20 ENCOUNTER — Encounter: Payer: Self-pay | Admitting: Internal Medicine

## 2010-12-12 ENCOUNTER — Encounter: Payer: Self-pay | Admitting: Internal Medicine

## 2010-12-24 ENCOUNTER — Ambulatory Visit (AMBULATORY_SURGERY_CENTER): Payer: Managed Care, Other (non HMO) | Admitting: *Deleted

## 2010-12-24 VITALS — Ht 64.5 in | Wt 155.0 lb

## 2010-12-24 DIAGNOSIS — Z1211 Encounter for screening for malignant neoplasm of colon: Secondary | ICD-10-CM

## 2010-12-24 MED ORDER — PEG-KCL-NACL-NASULF-NA ASC-C 100 G PO SOLR: ORAL | Status: DC

## 2010-12-25 ENCOUNTER — Encounter: Payer: Self-pay | Admitting: Internal Medicine

## 2010-12-30 ENCOUNTER — Encounter: Payer: Self-pay | Admitting: Internal Medicine

## 2010-12-30 ENCOUNTER — Ambulatory Visit (INDEPENDENT_AMBULATORY_CARE_PROVIDER_SITE_OTHER): Payer: Managed Care, Other (non HMO) | Admitting: Internal Medicine

## 2010-12-30 VITALS — BP 120/70 | Temp 97.7°F | Wt 156.0 lb

## 2010-12-30 DIAGNOSIS — M1990 Unspecified osteoarthritis, unspecified site: Secondary | ICD-10-CM

## 2010-12-30 MED ORDER — HYDROCODONE-HOMATROPINE 5-1.5 MG/5ML PO SYRP: 5.0000 mL | ORAL_SOLUTION | Freq: Four times a day (QID) | ORAL | Status: AC | PRN

## 2010-12-30 NOTE — Progress Notes (Signed)
  Subjective:    Patient ID: Maria Lloyd, female    DOB: 1945-04-10, 65 y.o.   MRN: [id number]  HPI 65 year old patient who has a history of osteoarthritis and takes chronic anti-inflammatory medication. For the past week she has had some mild cough and malaise. The past few days she has had increasing sore throat rhinorrhea and more significant cough. Her husband has had bronchitis. There's been no fever wheezing chest pain or shortness of breath.    Review of Systems  Constitutional: Positive for appetite change and fatigue. Negative for fever and chills.  HENT: Positive for congestion, sore throat and rhinorrhea.   Respiratory: Positive for cough.        Objective:   Physical Exam  Constitutional: She is oriented to person, place, and time. She appears well-developed and well-nourished.  HENT:  Head: Normocephalic.  Right Ear: External ear normal.  Left Ear: External ear normal.       Mild erythema of the oropharynx  Eyes: Conjunctivae and EOM are normal. Pupils are equal, round, and reactive to light.  Neck: Normal range of motion. Neck supple. No thyromegaly present.  Cardiovascular: Normal rate, regular rhythm, normal heart sounds and intact distal pulses.   Pulmonary/Chest: Effort normal and breath sounds normal. No respiratory distress. She has no wheezes.  Abdominal: Soft. Bowel sounds are normal. She exhibits no mass. There is no tenderness.  Musculoskeletal: Normal range of motion.  Lymphadenopathy:    She has no cervical adenopathy.  Neurological: She is alert and oriented to person, place, and time.  Skin: Skin is warm and dry. No rash noted.  Psychiatric: She has a normal mood and affect. Her behavior is normal.          Assessment & Plan:  Viral URI. We'll treat symptomatically

## 2010-12-30 NOTE — Patient Instructions (Signed)
Get plenty of rest, Drink lots of  clear liquids, and use Tylenol  for fever and discomfort.    Cough medicine as directed

## 2011-01-08 ENCOUNTER — Encounter: Payer: Self-pay | Admitting: Internal Medicine

## 2011-01-08 ENCOUNTER — Ambulatory Visit (AMBULATORY_SURGERY_CENTER): Payer: Managed Care, Other (non HMO) | Admitting: Internal Medicine

## 2011-01-08 VITALS — BP 141/91 | HR 83 | Temp 97.4°F | Resp 12 | Ht 64.0 in | Wt 155.0 lb

## 2011-01-08 DIAGNOSIS — Z1211 Encounter for screening for malignant neoplasm of colon: Secondary | ICD-10-CM

## 2011-01-08 DIAGNOSIS — D126 Benign neoplasm of colon, unspecified: Secondary | ICD-10-CM

## 2011-01-08 DIAGNOSIS — K5730 Diverticulosis of large intestine without perforation or abscess without bleeding: Secondary | ICD-10-CM

## 2011-01-08 MED ORDER — SODIUM CHLORIDE 0.9 % IV SOLN: 500.0000 mL | INTRAVENOUS | Status: DC

## 2011-01-08 NOTE — Patient Instructions (Signed)
Please refer to your blue and neon green sheets for instructions regarding diet and activity for the rest of today.  You may resume your medications as you would normally take them.   You will receive a letter in the mail in about two weeks regarding the results of the biopsies taken today.  Colon Polyps A polyp is extra tissue that grows inside your body. Colon polyps grow in the large intestine. The large intestine, also called the colon, is part of your digestive system. It is a long, hollow tube at the end of your digestive tract where your body makes and stores stool. Most polyps are not dangerous. They are benign. This means they are not cancerous. But over time, some types of polyps can turn into cancer. Polyps that are smaller than a pea are usually not harmful. But larger polyps could someday become or may already be cancerous. To be safe, doctors remove all polyps and test them.  WHO GETS POLYPS? Anyone can get polyps, but certain people are more likely than others. You may have a greater chance of getting polyps if:  You are over 50.   You have had polyps before.   Someone in your family has had polyps.   Someone in your family has had cancer of the large intestine.   Find out if someone in your family has had polyps. You may also be more likely to get polyps if you:   Eat a lot of fatty foods.   Smoke.   Drink alcohol.   Do not exercise.   Eat too much.  SYMPTOMS  Most small polyps do not cause symptoms. People often do not know they have one until their caregiver finds it during a regular checkup or while testing them for something else. Some people do have symptoms like these:  Bleeding from the anus. You might notice blood on your underwear or on toilet paper after you have had a bowel movement.   Constipation or diarrhea that lasts more than a week.   Blood in the stool. Blood can make stool look black or it can show up as red streaks in the stool.  If you have  any of these symptoms, see your caregiver. HOW DOES THE DOCTOR TEST FOR POLYPS? The doctor can use four tests to check for polyps:  Digital rectal exam. The caregiver wears gloves and checks your rectum (the last part of the large intestine) to see if it feels normal. This test would find polyps only in the rectum. Your caregiver may need to do one of the other tests listed below to find polyps higher up in the intestine.   Barium enema. The caregiver puts a liquid called barium into your rectum before taking x-rays of your large intestine. Barium makes your intestine look white in the pictures. Polyps are dark, so they are easy to see.   Sigmoidoscopy. With this test, the caregiver can see inside your large intestine. A thin flexible tube is placed into your rectum. The device is called a sigmoidoscope, which has a light and a tiny video camera in it. The caregiver uses the sigmoidoscope to look at the last third of your large intestine.   Colonoscopy. This test is like sigmoidoscopy, but the caregiver looks at all of the large intestine. It usually requires sedation. This is the most common method for finding and removing polyps.  TREATMENT   The caregiver will remove the polyp during sigmoidoscopy or colonoscopy. The polyp is then tested  for cancer.   If you have had polyps, your caregiver may want you to get tested regularly in the future.  PREVENTION  There is not one sure way to prevent polyps. You might be able to lower your risk of getting them if you:  Eat more fruits and vegetables and less fatty food.   Do not smoke.   Avoid alcohol.   Exercise every day.   Lose weight if you are overweight.   Eating more calcium and folate can also lower your risk of getting polyps. Some foods that are rich in calcium are milk, cheese, and broccoli. Some foods that are rich in folate are chickpeas, kidney beans, and spinach.   Aspirin might help prevent polyps. Studies are under way.    Document Released: 10/10/2003 Document Revised: 09/25/2010 Document Reviewed: 03/17/2007 [facility] Patient Information 2012 [place], [place].  Diverticulosis Diverticulosis is a common condition that develops when small pouches (diverticula) form in the wall of the colon. The risk of diverticulosis increases with age. It happens more often in people who eat a low-fiber diet. Most individuals with diverticulosis have no symptoms. Those individuals with symptoms usually experience abdominal pain, constipation, or loose stools (diarrhea). HOME CARE INSTRUCTIONS   Increase the amount of fiber in your diet as directed by your caregiver or dietician. This may reduce symptoms of diverticulosis.   Your caregiver may recommend taking a dietary fiber supplement.   Drink at least 6 to 8 glasses of water each day to prevent constipation.   Try not to strain when you have a bowel movement.   Your caregiver may recommend avoiding nuts and seeds to prevent complications, although this is still an uncertain benefit.   Only take over-the-counter or prescription medicines for pain, discomfort, or fever as directed by your caregiver.  FOODS WITH HIGH FIBER CONTENT INCLUDE:  Fruits. Apple, peach, pear, tangerine, raisins, prunes.   Vegetables. Brussels sprouts, asparagus, broccoli, cabbage, carrot, cauliflower, romaine lettuce, spinach, summer squash, tomato, winter squash, zucchini.   Starchy Vegetables. Baked beans, kidney beans, lima beans, split peas, lentils, potatoes (with skin).   Grains. Whole wheat bread, brown rice, bran flake cereal, plain oatmeal, white rice, shredded wheat, bran muffins.  SEEK IMMEDIATE MEDICAL CARE IF:   You develop increasing pain or severe bloating.   You have an oral temperature above 102 F (38.9 C), not controlled by medicine.   You develop vomiting or bowel movements that are bloody or black.  Document Released: 10/11/2003 Document Revised: 09/25/2010 Document  Reviewed: 06/13/2009 [facility] Patient Information 2012 [place], [place].  Hemorrhoids Hemorrhoids are enlarged (dilated) veins around the rectum. There are 2 types of hemorrhoids, and the type of hemorrhoid is determined by its location. Internal hemorrhoids occur in the veins just inside the rectum.They are usually not painful, but they may bleed.However, they may poke through to the outside and become irritated and painful. External hemorrhoids involve the veins outside the anus and can be felt as a painful swelling or hard lump near the anus.They are often itchy and may crack and bleed. Sometimes clots will form in the veins. This makes them swollen and painful. These are called thrombosed hemorrhoids. CAUSES Causes of hemorrhoids include:  Pregnancy. This increases the pressure in the hemorrhoidal veins.   Constipation.   Straining to have a bowel movement.   Obesity.   Heavy lifting or other activity that caused you to strain.  TREATMENT Most of the time hemorrhoids improve in 1 to 2 weeks. However, if symptoms do  not seem to be getting better or if you have a lot of rectal bleeding, your caregiver may perform a procedure to help make the hemorrhoids get smaller or remove them completely.Possible treatments include:  Rubber band ligation. A rubber band is placed at the base of the hemorrhoid to cut off the circulation.   Sclerotherapy. A chemical is injected to shrink the hemorrhoid.   Infrared light therapy. Tools are used to burn the hemorrhoid.   Hemorrhoidectomy. This is surgical removal of the hemorrhoid.  HOME CARE INSTRUCTIONS   Increase fiber in your diet. Ask your caregiver about using fiber supplements.   Drink enough water and fluids to keep your urine clear or pale yellow.   Exercise regularly.   Go to the bathroom when you have the urge to have a bowel movement. Do not wait.   Avoid straining to have bowel movements.   Keep the anal area dry and clean.    Only take over-the-counter or prescription medicines for pain, discomfort, or fever as directed by your caregiver.  If your hemorrhoids are thrombosed:  Take warm sitz baths for 20 to 30 minutes, 3 to 4 times per day.   If the hemorrhoids are very tender and swollen, place ice packs on the area as tolerated. Using ice packs between sitz baths may be helpful. Fill a plastic bag with ice. Place a towel between the bag of ice and your skin.   Medicated creams and suppositories may be used or applied as directed.   Do not use a donut-shaped pillow or sit on the toilet for long periods. This increases blood pooling and pain.  SEEK MEDICAL CARE IF:   You have increasing pain and swelling that is not controlled with your medicine.   You have uncontrolled bleeding.   You have difficulty or you are unable to have a bowel movement.   You have pain or inflammation outside the area of the hemorrhoids.   You have chills or an oral temperature above 102 F (38.9 C).  MAKE SURE YOU:   Understand these instructions.   Will watch your condition.   Will get help right away if you are not doing well or get worse.  Document Released: 01/11/2000 Document Revised: 09/25/2010 Document Reviewed: 05/18/2007 [facility] Patient Information 2012 [place], [place].

## 2011-01-08 NOTE — Op Note (Signed)
[facility] [address] [place], [place]  [address]  COLONOSCOPY PROCEDURE REPORT  PATIENT:  Maria Lloyd, Maria Lloyd  MR#:  [id number] BIRTHDATE:  [birth year], 65 yrs. old  GENDER:  female ENDOSCOPIST:  Wilhemina Bonito. Eda Keys, MD REF. BY:  Surveillance Program Recall, PROCEDURE DATE:  01/08/2011 PROCEDURE:  Colonoscopy with snare polypectomy x 2 ASA CLASS:  Class II INDICATIONS:  Routine-risk screening ; index exam 10-2003 negative (significant diverticulosis) MEDICATIONS:   Fentanyl 100 mcg IV, Versed 11 mg IV, These medications were titrated to patient response per physician's verbal order  DESCRIPTION OF PROCEDURE:   After the risks benefits and alternatives of the procedure were thoroughly explained, informed consent was obtained.  Digital rectal exam was performed and revealed no abnormalities.   The LB CF-[id number] endoscope was introduced through the anus and advanced to the cecum, which was identified by both the appendix and ileocecal valve, without limitations.  The quality of the prep was good, using MoviPrep. The instrument was then slowly withdrawn as the colon was fully examined. <<PROCEDUREIMAGES>>  FINDINGS:  Two small  polyps were found in the ascending colon and snared without cautery. Retrieval was successful.  Severe diverticulosis was found in the left colon.  Otherwise normal colonoscopy without other polyps, masses, vascular ectasias, or inflammatory changes.   Retroflexed views in the rectum revealed internal hemorrhoids.    The time to cecum = 5:35  minutes. The scope was then withdrawn in 10:49  minutes from the cecum and the procedure completed.  COMPLICATIONS:  None  ENDOSCOPIC IMPRESSION: 1) Two polyps in the ascending colon - removed 2) Severe diverticulosis in the left colon 3) Otherwise normal colonoscopy 4) Internal hemorrhoids  RECOMMENDATIONS: 1) Repeat colonoscopy in 5 years if polyp adenomatous; otherwise 10  years  ______________________________ Wilhemina Bonito. Eda Keys, MD  CC:  Lindley Magnus, MD; The Patient  n. eSIGNED:   Wilhemina Bonito. Eda Keys at 01/08/2011 03:45 PM  Delena Bali, [phone]

## 2011-01-08 NOTE — Progress Notes (Signed)
Pt was anxious about the procedure.  She states, "I don't want to know anything about it".  Pt did have cramping with scope advancement.  Medicationsa were titrated per Dr. Lamar Sprinkles orders.  Once the cecum was reached, and the scope was being with drawn, the pt relaxed and rested comfortably.  Pt tolerated the exam well. maw

## 2011-01-08 NOTE — Progress Notes (Signed)
Patient did not experience any of the following events: a burn prior to discharge; a fall within the facility; wrong site/side/patient/procedure/implant event; or a hospital transfer or hospital admission upon discharge from the facility. (G8907) Patient did not have preoperative order for IV antibiotic SSI prophylaxis. (G8918)  

## 2011-01-09 ENCOUNTER — Telehealth: Payer: Self-pay | Admitting: *Deleted

## 2011-01-09 NOTE — Telephone Encounter (Signed)
Message left to call us if necessary. 

## 2011-01-14 ENCOUNTER — Other Ambulatory Visit (INDEPENDENT_AMBULATORY_CARE_PROVIDER_SITE_OTHER): Payer: Managed Care, Other (non HMO)

## 2011-01-14 DIAGNOSIS — Z0000 Encounter for general adult medical examination without abnormal findings: Secondary | ICD-10-CM

## 2011-01-14 LAB — HEPATIC FUNCTION PANEL
ALT: 22 U/L (ref 0–35)
Albumin: 3.7 g/dL (ref 3.5–5.2)
Total Protein: 7 g/dL (ref 6.0–8.3)

## 2011-01-14 LAB — LIPID PANEL
Cholesterol: 188 mg/dL (ref 0–200)
HDL: 87.7 mg/dL (ref >39.00)
Triglycerides: 41 mg/dL (ref 0.0–149.0)

## 2011-01-14 LAB — POCT URINALYSIS DIPSTICK
Blood, UA: NEGATIVE
Glucose, UA: NEGATIVE
Ketones, UA: NEGATIVE
Nitrite, UA: NEGATIVE
Spec Grav, UA: 1.01
pH, UA: 7

## 2011-01-14 LAB — BASIC METABOLIC PANEL
BUN: 11 mg/dL (ref 6–23)
CO2: 29 mEq/L (ref 19–32)
Chloride: 105 mEq/L (ref 96–112)
Creatinine, Ser: 0.8 mg/dL (ref 0.4–1.2)
Potassium: 4.3 mEq/L (ref 3.5–5.1)

## 2011-01-14 LAB — CBC WITH DIFFERENTIAL/PLATELET
Basophils Relative: 0.7 % (ref 0.0–3.0)
Eosinophils Absolute: 0.2 10*3/uL (ref 0.0–0.7)
Eosinophils Relative: 4.2 % (ref 0.0–5.0)
HCT: 37.6 % (ref 36.0–46.0)
Lymphs Abs: 1.5 10*3/uL (ref 0.7–4.0)
MCHC: 33.6 g/dL (ref 30.0–36.0)
MCV: 92.5 fl (ref 78.0–100.0)
Monocytes Absolute: 0.6 10*3/uL (ref 0.1–1.0)
Neutrophils Relative %: 50.2 % (ref 43.0–77.0)
RBC: 4.06 Mil/uL (ref 3.87–5.11)
WBC: 4.7 10*3/uL (ref 4.5–10.5)

## 2011-01-20 ENCOUNTER — Encounter: Payer: Self-pay | Admitting: Internal Medicine

## 2011-01-20 ENCOUNTER — Ambulatory Visit (INDEPENDENT_AMBULATORY_CARE_PROVIDER_SITE_OTHER): Payer: Managed Care, Other (non HMO) | Admitting: Internal Medicine

## 2011-01-20 VITALS — BP 122/74 | HR 76 | Temp 97.8°F | Ht 64.0 in | Wt 158.0 lb

## 2011-01-20 DIAGNOSIS — Z0000 Encounter for general adult medical examination without abnormal findings: Secondary | ICD-10-CM

## 2011-01-20 DIAGNOSIS — Z23 Encounter for immunization: Secondary | ICD-10-CM

## 2011-01-20 DIAGNOSIS — R1310 Dysphagia, unspecified: Secondary | ICD-10-CM | POA: Insufficient documentation

## 2011-01-20 MED ORDER — OMEPRAZOLE 20 MG PO CPDR: 20.0000 mg | DELAYED_RELEASE_CAPSULE | Freq: Every day | ORAL | Status: DC

## 2011-01-20 NOTE — Assessment & Plan Note (Signed)
New complaint, Needs EGD Will start PPI  Note on etodolac---could be related to sxs

## 2011-01-20 NOTE — Progress Notes (Signed)
Patient ID: Maria Lloyd, female   DOB: 02-16-1945, 65 y.o.   MRN: [id number] Swallowing: More difficult past several months. Choked on food frequently. Feels like throat is swollen at times.   Rare episode of dizziness---very uncommon. Typically happens when she is walking  CPX  Past Medical History  Diagnosis Date  . Depression   . Arthritis     History   Social History  . Marital Status: Married    Spouse Name: N/A    Number of Children: N/A  . Years of Education: N/A   Occupational History  . Not on file.   Social History Main Topics  . Smoking status: Never Smoker   . Smokeless tobacco: Never Used  . Alcohol Use: 0.6 oz/week    1 Shots of liquor per week  . Drug Use: No  . Sexually Active: Not on file   Other Topics Concern  . Not on file   Social History Narrative  . No narrative on file    Past Surgical History  Procedure Date  . Back surgery 05/2005 &01/2010    spondylolisthesis  . Total abdominal hysterectomy w/ bilateral salpingoophorectomy 09/1997    Family History  Problem Relation Age of Onset  . Stroke Mother   . Cancer Mother     lung  . Cancer Father     prostate    No Known Allergies  Current Outpatient Prescriptions on File Prior to Visit  Medication Sig Dispense Refill  . buPROPion (WELLBUTRIN XL) 300 MG 24 hr tablet Take 1 tablet (300 mg total) by mouth every morning.  90 tablet  0  . cholecalciferol (VITAMIN D) 1000 UNITS tablet Take 1,000 Units by mouth 2 (two) times a week.        . estradiol (ESTRACE) 0.1 MG/GM vaginal cream Place 2 g vaginally daily.        Marland Kitchen estradiol (VIVELLE-DOT) 0.0375 MG/24HR Place 1 patch onto the skin 2 (two) times a week.        . etodolac (LODINE) 400 MG tablet Take 1 tablet (400 mg total) by mouth 2 (two) times daily.  180 tablet  0  . fish oil-omega-3 fatty acids 1000 MG capsule Take 2 g by mouth daily.        . Misc Natural Products (OSTEO BI-FLEX ADV DOUBLE ST PO) Take 1 tablet by mouth 2 (two)  times daily.        . Multiple Vitamins-Minerals (MULTIVITAMIN WITH MINERALS) tablet Take 1 tablet by mouth daily.           patient denies chest pain, shortness of breath, orthopnea. Denies lower extremity edema, abdominal pain, change in appetite, change in bowel movements. Patient denies rashes, musculoskeletal complaints. No other specific complaints in a complete review of systems.   BP 122/74  Pulse 76  Temp(Src) 97.8 F (36.6 C) (Oral)  Ht 5\' 4"  (1.626 m)  Wt 158 lb (71.668 kg)  BMI 27.12 kg/m2  Well-developed well-nourished female in no acute distress. HEENT exam atraumatic, normocephalic, extraocular muscles are intact. Neck is supple. No jugular venous distention no thyromegaly. Chest clear to auscultation without increased work of breathing. Cardiac exam S1 and S2 are regular. Abdominal exam active bowel sounds, soft, nontender. Extremities no edema. Neurologic exam she is alert without any motor sensory deficits. Gait is normal.  A/P: Well visit: health maint UTD

## 2011-02-14 ENCOUNTER — Ambulatory Visit: Payer: Managed Care, Other (non HMO) | Admitting: Internal Medicine

## 2011-02-20 ENCOUNTER — Telehealth: Payer: Self-pay | Admitting: Internal Medicine

## 2011-02-20 NOTE — Telephone Encounter (Signed)
Aleve should be fine. One tablet twice daily as needed.

## 2011-02-20 NOTE — Telephone Encounter (Signed)
Patient is taking Aleve. However, she would like to know should she go back on Etodolac? Her pharmacy is [facility] mail order. Thanks. Please return call.

## 2011-02-20 NOTE — Telephone Encounter (Signed)
LMTCB

## 2011-02-21 NOTE — Telephone Encounter (Signed)
Pt returned call from nurse. Pt has been informed info about Aleve as noted below.

## 2011-03-12 ENCOUNTER — Encounter: Payer: Self-pay | Admitting: Internal Medicine

## 2011-03-12 ENCOUNTER — Ambulatory Visit (INDEPENDENT_AMBULATORY_CARE_PROVIDER_SITE_OTHER): Payer: Managed Care, Other (non HMO) | Admitting: Internal Medicine

## 2011-03-12 VITALS — BP 120/62 | HR 68 | Ht 64.0 in | Wt 159.0 lb

## 2011-03-12 DIAGNOSIS — R198 Other specified symptoms and signs involving the digestive system and abdomen: Secondary | ICD-10-CM

## 2011-03-12 DIAGNOSIS — R1310 Dysphagia, unspecified: Secondary | ICD-10-CM

## 2011-03-12 DIAGNOSIS — Z86010 Personal history of colonic polyps: Secondary | ICD-10-CM

## 2011-03-12 DIAGNOSIS — K5730 Diverticulosis of large intestine without perforation or abscess without bleeding: Secondary | ICD-10-CM

## 2011-03-12 NOTE — Patient Instructions (Addendum)
  You have been scheduled for an endoscopy with dilation and propofol. Please follow written instructions given to you at your visit today.  Dr. Marina Goodell has suggested you add a fiber supplement to your diet, such as metamucil.  You have also been given some literature on the procedure as well as diverticultitis

## 2011-03-12 NOTE — Progress Notes (Signed)
HISTORY OF PRESENT ILLNESS:  Maria Lloyd is a 66 y.o. female with the below listed medical history who was seen D. similar 12th 2012 regarding screening colonoscopy. She was found to have diminutive adenomatous colon polyps which were removed. In addition severe left-sided diverticulosis and internal hemorrhoids. Followup in 5 years recommended. The patient presents today, at the request of Dr. Wendi Snipes dysphagia. Also, she wishes to discuss diverticular disease. Finally, complains of chronic alternating bowel habits. First, she reports a less than 1 year history of intermittent pill dysphagia. Also intermittent solid food dysphagia beginning around September of 2012. Rare problems with indigestion. In December, she was placed on omeprazole 20 mg daily. She thinks this has helped some. She has had some weight gain. Next, chronic, for years, alternating bowel habits between constipation (predominately) and loose stools. No obvious exacerbating or relieving factors. No nocturnal component.. No significant associated abdominal pain or urgency. No bleeding. Prior colonoscopy results as outlined. Finally, questions on diverticulosis, its complications, and dietary measures. Patient has not had any complications of diverticular disease to date.   REVIEW OF SYSTEMS:  All non-GI ROS negative except for arthritis, back pain, depression, fatigue, itching, question rash, sore throat, excessive urination, urinary leakage  Past Medical History  Diagnosis Date  . Depression   . Arthritis   . GERD (gastroesophageal reflux disease)   . Diverticulosis 2012    Colonoscopy  . Internal hemorrhoids 2012     Colonoscopy   . Colon polyps 2012    Colonoscopy-Hyperplastic and Tubular Adenoma     Past Surgical History  Procedure Date  . Back surgery 05/2005 &01/2010    spondylolisthesis  . Total abdominal hysterectomy w/ bilateral salpingoophorectomy 09/1997    Social History [name]   reports that she has never smoked. She has never used smokeless tobacco. She reports that she drinks alcohol. She reports that she does not use illicit drugs.  family history includes Lung cancer in her mother; Prostate cancer in her father; and Stroke in her mother.  There is no history of Colon cancer.  No Known Allergies     PHYSICAL EXAMINATION: Vital signs: BP 120/62  Pulse 68  Ht 5\' 4"  (1.626 m)  Wt 159 lb (72.122 kg)  BMI 27.29 kg/m2  Constitutional: generally well-appearing, no acute distress Psychiatric: alert and oriented x3, cooperative Eyes: extraocular movements intact, anicteric, conjunctiva pink Mouth: oral pharynx moist, no lesions Neck: supple no lymphadenopathy Cardiovascular: heart regular rate and rhythm, no murmur Lungs: clear to auscultation bilaterally Abdomen: soft, nontender, nondistended, no obvious ascites, no peritoneal signs, normal bowel sounds, no organomegaly Rectal: Omitted. Extremities: no lower extremity edema bilaterally Skin: no lesions on visible extremities Neuro: No focal deficits.   ASSESSMENT:  #1. Intermittent solid food dysphagia. Rule out stricture, web, or ring. #2. Altered bowel habits. Functional. #3. Diverticulosis without complications #4. Adenomatous colon polyps on colonoscopy December 2012  PLAN:  #1. Continue PPI for time being #2. Schedule upper endoscopy with probable esophageal dilation.The nature of the procedure, as well as the risks, benefits, and alternatives were carefully and thoroughly reviewed with the patient. Ample time for discussion and questions allowed. The patient understood, was satisfied, and agreed to proceed. We discussed propofol sedation, as she had difficulties awakening after her recent procedure with moderate sedation #3. Detailed discussion today regarding the epidemiology of diverticulosis as well as potential complications. As well, I provided her with supplemental literature on the topic for  review #4. Fiber supplementation recommended for alternating  bowel habits. #5. Routine surveillance colonoscopy around December 2017

## 2011-03-19 ENCOUNTER — Encounter: Payer: Managed Care, Other (non HMO) | Admitting: Internal Medicine

## 2011-05-23 ENCOUNTER — Other Ambulatory Visit: Payer: Self-pay | Admitting: Internal Medicine

## 2011-05-23 DIAGNOSIS — F329 Major depressive disorder, single episode, unspecified: Secondary | ICD-10-CM

## 2011-05-23 MED ORDER — BUPROPION HCL ER (XL) 300 MG PO TB24: 300.0000 mg | ORAL_TABLET | ORAL | Status: DC

## 2011-05-23 NOTE — Telephone Encounter (Signed)
Pt needs refill on generic wellbutrin 300mg  once a day #10 call into [facility] [phone] also [facility]ills. Pt has 2 pills left

## 2011-05-23 NOTE — Telephone Encounter (Signed)
rx sent in electronically 

## 2011-07-04 ENCOUNTER — Other Ambulatory Visit: Payer: Self-pay | Admitting: Internal Medicine

## 2011-07-04 DIAGNOSIS — Z1231 Encounter for screening mammogram for malignant neoplasm of breast: Secondary | ICD-10-CM

## 2011-07-22 ENCOUNTER — Ambulatory Visit: Payer: Managed Care, Other (non HMO)

## 2011-07-25 ENCOUNTER — Ambulatory Visit
Admission: RE | Admit: 2011-07-25 | Discharge: 2011-07-25 | Disposition: A | Discharge status: Home / Self Care | Payer: Managed Care, Other (non HMO) | Source: Ambulatory Visit | Attending: Internal Medicine | Admitting: Internal Medicine

## 2011-07-25 DIAGNOSIS — Z1231 Encounter for screening mammogram for malignant neoplasm of breast: Secondary | ICD-10-CM

## 2011-08-08 ENCOUNTER — Other Ambulatory Visit: Payer: Self-pay | Admitting: Neurological Surgery

## 2011-08-11 ENCOUNTER — Other Ambulatory Visit (HOSPITAL_COMMUNITY): Payer: Managed Care, Other (non HMO)

## 2011-08-20 NOTE — Pre-Procedure Instructions (Signed)
20 Maria Lloyd  08/20/2011   Your procedure is scheduled on:  Thursday August 28, 2011.  Report to [facility] at 0530 AM.  Call this number if you have problems the morning of surgery: [phone]   Remember:   Do not eat food or drink:After Midnight.    Take these medicines the morning of surgery with A SIP OF WATER: Bupropion (Wellbutrin), and Tramadol (Ultram) if needed for pain.   Do not wear jewelry, make-up or nail polish.  Do not wear lotions, powders, or perfumes.   Do not shave 48 hours prior to surgery.   Do not bring valuables to the hospital.  Contacts, dentures or bridgework may not be worn into surgery.  Leave suitcase in the car. After surgery it may be brought to your room.  For patients admitted to the hospital, checkout time is 11:00 AM the day of discharge.   Patients discharged the day of surgery will not be allowed to drive home.  Name and phone number of your driver:   Special Instructions: CHG Shower Use Special Wash: 1/2 bottle night before surgery and 1/2 bottle morning of surgery.   Please read over the following fact sheets that you were given: Pain Booklet, Coughing and Deep Breathing, Blood Transfusion Information, MRSA Information and Surgical Site Infection Prevention

## 2011-08-21 ENCOUNTER — Ambulatory Visit (HOSPITAL_COMMUNITY)
Admission: RE | Admit: 2011-08-21 | Discharge: 2011-08-21 | Disposition: A | Discharge status: Home / Self Care | Payer: Managed Care, Other (non HMO) | Source: Ambulatory Visit | Attending: Neurological Surgery | Admitting: Neurological Surgery

## 2011-08-21 ENCOUNTER — Encounter (HOSPITAL_COMMUNITY): Payer: Self-pay

## 2011-08-21 ENCOUNTER — Encounter (HOSPITAL_COMMUNITY)
Admission: RE | Admit: 2011-08-21 | Discharge: 2011-08-21 | Disposition: A | Discharge status: Home / Self Care | Payer: Managed Care, Other (non HMO) | Source: Ambulatory Visit | Attending: Neurological Surgery | Admitting: Neurological Surgery

## 2011-08-21 DIAGNOSIS — Z01818 Encounter for other preprocedural examination: Secondary | ICD-10-CM | POA: Insufficient documentation

## 2011-08-21 LAB — DIFFERENTIAL
Basophils Relative: 1 % (ref 0–1)
Lymphocytes Relative: 35 % (ref 12–46)
Lymphs Abs: 1.7 10*3/uL (ref 0.7–4.0)
Monocytes Relative: 12 % (ref 3–12)
Neutro Abs: 2.2 10*3/uL (ref 1.7–7.7)
Neutrophils Relative %: 47 % (ref 43–77)

## 2011-08-21 LAB — CBC
HCT: 39.7 % (ref 36.0–46.0)
Hemoglobin: 13.8 g/dL (ref 12.0–15.0)
MCH: 31.4 pg (ref 26.0–34.0)
MCV: 90.4 fL (ref 78.0–100.0)
RBC: 4.39 MIL/uL (ref 3.87–5.11)

## 2011-08-21 LAB — BASIC METABOLIC PANEL
BUN: 13 mg/dL (ref 6–23)
CO2: 26 mEq/L (ref 19–32)
Chloride: 103 mEq/L (ref 96–112)
Glucose, Bld: 70 mg/dL (ref 70–99)
Potassium: 4 mEq/L (ref 3.5–5.1)

## 2011-08-21 LAB — TYPE AND SCREEN: ABO/RH(D): A POS

## 2011-08-27 MED ORDER — DEXAMETHASONE SODIUM PHOSPHATE 10 MG/ML IJ SOLN: 10.0000 mg | INTRAMUSCULAR | Status: AC

## 2011-08-27 MED ORDER — CEFAZOLIN SODIUM-DEXTROSE 2-3 GM-% IV SOLR: 2.0000 g | INTRAVENOUS | Status: DC

## 2011-08-27 MED ORDER — CEFAZOLIN SODIUM 1-5 GM-% IV SOLN: 1.0000 g | INTRAVENOUS | Status: DC

## 2011-08-28 ENCOUNTER — Encounter (HOSPITAL_COMMUNITY): Admission: RE | Payer: Self-pay | Source: Ambulatory Visit

## 2011-08-28 ENCOUNTER — Ambulatory Visit (HOSPITAL_COMMUNITY)
Admission: RE | Admit: 2011-08-28 | Payer: Managed Care, Other (non HMO) | Source: Ambulatory Visit | Admitting: Neurological Surgery

## 2011-08-28 SURGERY — POSTERIOR LUMBAR FUSION 1 WITH HARDWARE REMOVAL
Anesthesia: General | Site: Back

## 2011-08-29 ENCOUNTER — Encounter (HOSPITAL_COMMUNITY): Payer: Self-pay | Admitting: Pharmacy Technician

## 2011-09-04 ENCOUNTER — Encounter (HOSPITAL_COMMUNITY): Payer: Self-pay | Admitting: Anesthesiology

## 2011-09-04 ENCOUNTER — Encounter (HOSPITAL_COMMUNITY)
Admission: RE | Disposition: A | Discharge status: Home / Self Care | Payer: Self-pay | Source: Ambulatory Visit | Attending: Neurological Surgery

## 2011-09-04 ENCOUNTER — Inpatient Hospital Stay (HOSPITAL_COMMUNITY): Payer: Managed Care, Other (non HMO) | Admitting: Anesthesiology

## 2011-09-04 ENCOUNTER — Inpatient Hospital Stay (HOSPITAL_COMMUNITY)
Admission: RE | Admit: 2011-09-04 | Discharge: 2011-09-06 | DRG: 460 | Disposition: A | Discharge status: Home / Self Care | Payer: Managed Care, Other (non HMO) | Source: Ambulatory Visit | Attending: Neurological Surgery | Admitting: Neurological Surgery

## 2011-09-04 ENCOUNTER — Inpatient Hospital Stay (HOSPITAL_COMMUNITY): Payer: Managed Care, Other (non HMO)

## 2011-09-04 ENCOUNTER — Encounter (HOSPITAL_COMMUNITY): Payer: Self-pay | Admitting: Neurological Surgery

## 2011-09-04 DIAGNOSIS — M5126 Other intervertebral disc displacement, lumbar region: Principal | ICD-10-CM | POA: Diagnosis present

## 2011-09-04 DIAGNOSIS — Z860100 Personal history of colon polyps, unspecified: Secondary | ICD-10-CM

## 2011-09-04 DIAGNOSIS — Z9079 Acquired absence of other genital organ(s): Secondary | ICD-10-CM

## 2011-09-04 DIAGNOSIS — R209 Unspecified disturbances of skin sensation: Secondary | ICD-10-CM | POA: Diagnosis present

## 2011-09-04 DIAGNOSIS — Z8719 Personal history of other diseases of the digestive system: Secondary | ICD-10-CM

## 2011-09-04 DIAGNOSIS — F3289 Other specified depressive episodes: Secondary | ICD-10-CM | POA: Diagnosis present

## 2011-09-04 DIAGNOSIS — K219 Gastro-esophageal reflux disease without esophagitis: Secondary | ICD-10-CM | POA: Diagnosis present

## 2011-09-04 DIAGNOSIS — F329 Major depressive disorder, single episode, unspecified: Secondary | ICD-10-CM | POA: Diagnosis present

## 2011-09-04 DIAGNOSIS — Z90710 Acquired absence of both cervix and uterus: Secondary | ICD-10-CM

## 2011-09-04 DIAGNOSIS — M129 Arthropathy, unspecified: Secondary | ICD-10-CM | POA: Diagnosis present

## 2011-09-04 HISTORY — PX: LAMINECTOMY: SHX219

## 2011-09-04 SURGERY — POSTERIOR LUMBAR FUSION 1 WITH HARDWARE REMOVAL
Anesthesia: General | Site: Back | Wound class: Clean

## 2011-09-04 MED ORDER — DEXAMETHASONE SODIUM PHOSPHATE 10 MG/ML IJ SOLN
INTRAMUSCULAR | Status: DC | PRN
  Administered 2011-09-04: 10 mg via INTRAVENOUS

## 2011-09-04 MED ORDER — ARTIFICIAL TEARS OP OINT
TOPICAL_OINTMENT | OPHTHALMIC | Status: DC | PRN
  Administered 2011-09-04: 1 via OPHTHALMIC

## 2011-09-04 MED ORDER — LIDOCAINE HCL 1 % IJ SOLN
INTRAMUSCULAR | Status: DC | PRN
  Administered 2011-09-04: 40 mg via INTRADERMAL

## 2011-09-04 MED ORDER — PANTOPRAZOLE SODIUM 40 MG IV SOLR
40.0000 mg | Freq: Every day | INTRAVENOUS | Status: DC
  Administered 2011-09-04: 40 mg via INTRAVENOUS
  Filled 2011-09-04 (×2): qty 40

## 2011-09-04 MED ORDER — MIDAZOLAM HCL 2 MG/2ML IJ SOLN: 0.5000 mg | Freq: Once | INTRAMUSCULAR | Status: DC | PRN

## 2011-09-04 MED ORDER — OXYCODONE-ACETAMINOPHEN 5-325 MG PO TABS
1.0000 | ORAL_TABLET | ORAL | Status: DC | PRN
  Administered 2011-09-04 – 2011-09-06 (×5): 2 via ORAL
  Filled 2011-09-04 (×5): qty 2

## 2011-09-04 MED ORDER — 0.9 % SODIUM CHLORIDE (POUR BTL) OPTIME
TOPICAL | Status: DC | PRN
  Administered 2011-09-04: 1000 mL

## 2011-09-04 MED ORDER — ROCURONIUM BROMIDE 100 MG/10ML IV SOLN
INTRAVENOUS | Status: DC | PRN
  Administered 2011-09-04: 50 mg via INTRAVENOUS

## 2011-09-04 MED ORDER — METHOCARBAMOL 500 MG PO TABS
500.0000 mg | ORAL_TABLET | Freq: Four times a day (QID) | ORAL | Status: DC | PRN
  Administered 2011-09-05 (×2): 500 mg via ORAL
  Filled 2011-09-04 (×2): qty 1

## 2011-09-04 MED ORDER — ACETAMINOPHEN 325 MG PO TABS: 650.0000 mg | ORAL_TABLET | ORAL | Status: DC | PRN

## 2011-09-04 MED ORDER — EPHEDRINE SULFATE 50 MG/ML IJ SOLN
INTRAMUSCULAR | Status: DC | PRN
  Administered 2011-09-04: 10 mg via INTRAVENOUS
  Administered 2011-09-04 (×3): 5 mg via INTRAVENOUS

## 2011-09-04 MED ORDER — HYDROMORPHONE HCL PF 1 MG/ML IJ SOLN
0.2500 mg | INTRAMUSCULAR | Status: DC | PRN
  Administered 2011-09-04 (×4): 0.5 mg via INTRAVENOUS

## 2011-09-04 MED ORDER — HEPARIN SODIUM (PORCINE) 1000 UNIT/ML IJ SOLN
INTRAMUSCULAR | Status: AC
  Filled 2011-09-04: qty 1

## 2011-09-04 MED ORDER — THROMBIN 5000 UNITS EX SOLR
CUTANEOUS | Status: DC | PRN
  Administered 2011-09-04: 20000 [IU] via TOPICAL

## 2011-09-04 MED ORDER — PROMETHAZINE HCL 25 MG/ML IJ SOLN: 6.2500 mg | INTRAMUSCULAR | Status: DC | PRN

## 2011-09-04 MED ORDER — THROMBIN 20000 UNITS EX KIT
PACK | CUTANEOUS | Status: DC | PRN
  Administered 2011-09-04: 20000 [IU] via TOPICAL

## 2011-09-04 MED ORDER — ONDANSETRON HCL 4 MG/2ML IJ SOLN: 4.0000 mg | INTRAMUSCULAR | Status: DC | PRN

## 2011-09-04 MED ORDER — DEXAMETHASONE SODIUM PHOSPHATE 4 MG/ML IJ SOLN
4.0000 mg | Freq: Four times a day (QID) | INTRAMUSCULAR | Status: DC
  Filled 2011-09-04 (×8): qty 1

## 2011-09-04 MED ORDER — MENTHOL 3 MG MT LOZG: 1.0000 | LOZENGE | OROMUCOSAL | Status: DC | PRN

## 2011-09-04 MED ORDER — HYDROMORPHONE HCL PF 1 MG/ML IJ SOLN
INTRAMUSCULAR | Status: AC
  Filled 2011-09-04: qty 1

## 2011-09-04 MED ORDER — NEOSTIGMINE METHYLSULFATE 1 MG/ML IJ SOLN
INTRAMUSCULAR | Status: DC | PRN
  Administered 2011-09-04: 4 mg via INTRAVENOUS

## 2011-09-04 MED ORDER — MEPERIDINE HCL 25 MG/ML IJ SOLN: 6.2500 mg | INTRAMUSCULAR | Status: DC | PRN

## 2011-09-04 MED ORDER — LACTATED RINGERS IV SOLN: INTRAVENOUS | Status: DC

## 2011-09-04 MED ORDER — PROPOFOL 10 MG/ML IV EMUL
INTRAVENOUS | Status: DC | PRN
  Administered 2011-09-04: 160 mg via INTRAVENOUS

## 2011-09-04 MED ORDER — LACTATED RINGERS IV SOLN
INTRAVENOUS | Status: DC | PRN
  Administered 2011-09-04 (×2): via INTRAVENOUS

## 2011-09-04 MED ORDER — BUPROPION HCL ER (XL) 300 MG PO TB24
300.0000 mg | ORAL_TABLET | ORAL | Status: DC
Start: 2011-09-05 — End: 2011-09-06
  Administered 2011-09-05 – 2011-09-06 (×2): 300 mg via ORAL
  Filled 2011-09-04 (×4): qty 1

## 2011-09-04 MED ORDER — ZOLPIDEM TARTRATE 5 MG PO TABS: 5.0000 mg | ORAL_TABLET | Freq: Every evening | ORAL | Status: DC | PRN

## 2011-09-04 MED ORDER — MIDAZOLAM HCL 5 MG/5ML IJ SOLN
INTRAMUSCULAR | Status: DC | PRN
  Administered 2011-09-04: 2 mg via INTRAVENOUS

## 2011-09-04 MED ORDER — ONDANSETRON HCL 4 MG/2ML IJ SOLN
INTRAMUSCULAR | Status: DC | PRN
  Administered 2011-09-04: 4 mg via INTRAVENOUS

## 2011-09-04 MED ORDER — DEXAMETHASONE SODIUM PHOSPHATE 10 MG/ML IJ SOLN
INTRAMUSCULAR | Status: AC
  Filled 2011-09-04: qty 1

## 2011-09-04 MED ORDER — BACITRACIN 50000 UNITS IM SOLR
INTRAMUSCULAR | Status: AC
  Filled 2011-09-04: qty 1

## 2011-09-04 MED ORDER — SENNA 8.6 MG PO TABS
1.0000 | ORAL_TABLET | Freq: Two times a day (BID) | ORAL | Status: DC
  Administered 2011-09-04 – 2011-09-06 (×4): 8.6 mg via ORAL
  Filled 2011-09-04 (×7): qty 1

## 2011-09-04 MED ORDER — ACETAMINOPHEN 10 MG/ML IV SOLN
INTRAVENOUS | Status: AC
  Administered 2011-09-04: 1000 mg via INTRAVENOUS
  Filled 2011-09-04: qty 100

## 2011-09-04 MED ORDER — HEMOSTATIC AGENTS (NO CHARGE) OPTIME
TOPICAL | Status: DC | PRN
  Administered 2011-09-04: 1 via TOPICAL

## 2011-09-04 MED ORDER — SODIUM CHLORIDE 0.9 % IV SOLN: 250.0000 mL | INTRAVENOUS | Status: DC

## 2011-09-04 MED ORDER — DEXTROSE 5 % IV SOLN
500.0000 mg | Freq: Once | INTRAVENOUS | Status: AC
  Administered 2011-09-04: 500 mg via INTRAVENOUS
  Filled 2011-09-04: qty 5

## 2011-09-04 MED ORDER — SODIUM CHLORIDE 0.9 % IJ SOLN
3.0000 mL | Freq: Two times a day (BID) | INTRAMUSCULAR | Status: DC
  Administered 2011-09-04 – 2011-09-06 (×4): 3 mL via INTRAVENOUS

## 2011-09-04 MED ORDER — ACETAMINOPHEN 650 MG RE SUPP: 650.0000 mg | RECTAL | Status: DC | PRN

## 2011-09-04 MED ORDER — CELECOXIB 200 MG PO CAPS
200.0000 mg | ORAL_CAPSULE | Freq: Two times a day (BID) | ORAL | Status: DC
  Administered 2011-09-04 – 2011-09-06 (×4): 200 mg via ORAL
  Filled 2011-09-04 (×6): qty 1

## 2011-09-04 MED ORDER — METHOCARBAMOL 100 MG/ML IJ SOLN
500.0000 mg | Freq: Four times a day (QID) | INTRAVENOUS | Status: DC | PRN
  Administered 2011-09-04: 500 mg via INTRAVENOUS
  Filled 2011-09-04: qty 5

## 2011-09-04 MED ORDER — VECURONIUM BROMIDE 10 MG IV SOLR
INTRAVENOUS | Status: DC | PRN
  Administered 2011-09-04: 2 mg via INTRAVENOUS
  Administered 2011-09-04: 1 mg via INTRAVENOUS
  Administered 2011-09-04: 3 mg via INTRAVENOUS
  Administered 2011-09-04: 2 mg via INTRAVENOUS

## 2011-09-04 MED ORDER — BUPIVACAINE HCL (PF) 0.25 % IJ SOLN
INTRAMUSCULAR | Status: DC | PRN
  Administered 2011-09-04: 6 mL
  Administered 2011-09-04: 10 mL

## 2011-09-04 MED ORDER — ACETAMINOPHEN 10 MG/ML IV SOLN
1000.0000 mg | Freq: Four times a day (QID) | INTRAVENOUS | Status: AC
  Administered 2011-09-05 (×3): 1000 mg via INTRAVENOUS
  Filled 2011-09-04 (×5): qty 100

## 2011-09-04 MED ORDER — DEXAMETHASONE 4 MG PO TABS
4.0000 mg | ORAL_TABLET | Freq: Four times a day (QID) | ORAL | Status: DC
  Administered 2011-09-04 – 2011-09-06 (×7): 4 mg via ORAL
  Filled 2011-09-04 (×11): qty 1

## 2011-09-04 MED ORDER — GLYCOPYRROLATE 0.2 MG/ML IJ SOLN
INTRAMUSCULAR | Status: DC | PRN
  Administered 2011-09-04: .6 mg via INTRAVENOUS

## 2011-09-04 MED ORDER — SODIUM CHLORIDE 0.9 % IJ SOLN: 3.0000 mL | INTRAMUSCULAR | Status: DC | PRN

## 2011-09-04 MED ORDER — MORPHINE SULFATE 2 MG/ML IJ SOLN
1.0000 mg | INTRAMUSCULAR | Status: DC | PRN
  Administered 2011-09-04: 2 mg via INTRAVENOUS
  Filled 2011-09-04: qty 1

## 2011-09-04 MED ORDER — HYDROMORPHONE HCL PF 1 MG/ML IJ SOLN: 0.2500 mg | INTRAMUSCULAR | Status: DC | PRN

## 2011-09-04 MED ORDER — SODIUM CHLORIDE 0.9 % IR SOLN
Status: DC | PRN
  Administered 2011-09-04: 12:00:00

## 2011-09-04 MED ORDER — SODIUM CHLORIDE 0.9 % IV SOLN
INTRAVENOUS | Status: AC
  Filled 2011-09-04: qty 500

## 2011-09-04 MED ORDER — POTASSIUM CHLORIDE IN NACL 20-0.9 MEQ/L-% IV SOLN
INTRAVENOUS | Status: DC
  Administered 2011-09-04: 1000 mL via INTRAVENOUS
  Filled 2011-09-04 (×5): qty 1000

## 2011-09-04 MED ORDER — ESTRADIOL 0.05 MG/24HR TD PTWK
0.0500 mg | MEDICATED_PATCH | TRANSDERMAL | Status: DC
  Filled 2011-09-04: qty 1

## 2011-09-04 MED ORDER — FENTANYL CITRATE 0.05 MG/ML IJ SOLN
INTRAMUSCULAR | Status: DC | PRN
  Administered 2011-09-04: 100 ug via INTRAVENOUS
  Administered 2011-09-04: 125 ug via INTRAVENOUS
  Administered 2011-09-04: 25 ug via INTRAVENOUS

## 2011-09-04 MED ORDER — CEFAZOLIN SODIUM 1-5 GM-% IV SOLN
1.0000 g | Freq: Three times a day (TID) | INTRAVENOUS | Status: AC
  Administered 2011-09-04 – 2011-09-05 (×2): 1 g via INTRAVENOUS
  Filled 2011-09-04 (×2): qty 50

## 2011-09-04 MED ORDER — HETASTARCH-ELECTROLYTES 6 % IV SOLN
INTRAVENOUS | Status: DC | PRN
  Administered 2011-09-04: 14:00:00 via INTRAVENOUS

## 2011-09-04 MED ORDER — PHENOL 1.4 % MT LIQD: 1.0000 | OROMUCOSAL | Status: DC | PRN

## 2011-09-04 MED ORDER — CEFAZOLIN SODIUM 1-5 GM-% IV SOLN
INTRAVENOUS | Status: AC
  Administered 2011-09-04: 1 g via INTRAVENOUS
  Filled 2011-09-04: qty 50

## 2011-09-04 MED ORDER — LIDOCAINE HCL 4 % MT SOLN
OROMUCOSAL | Status: DC | PRN
  Administered 2011-09-04: 4 mL via TOPICAL

## 2011-09-04 SURGICAL SUPPLY — 57 items
APL SKNCLS STERI-STRIP NONHPOA (GAUZE/BANDAGES/DRESSINGS) ×1
BAG DECANTER FOR FLEXI CONT (MISCELLANEOUS) ×2 IMPLANT
BENZOIN TINCTURE PRP APPL 2/3 (GAUZE/BANDAGES/DRESSINGS) ×2 IMPLANT
BLADE SURG ROTATE 9660 (MISCELLANEOUS) IMPLANT
BUR MATCHSTICK NEURO 3.0 LAGG (BURR) ×2 IMPLANT
CAGE CAPSTONE BULLET 8X22 (Cage) ×4 IMPLANT
CANISTER SUCTION 2500CC (MISCELLANEOUS) ×2 IMPLANT
CLOTH BEACON ORANGE TIMEOUT ST (SAFETY) ×2 IMPLANT
CONT SPEC 4OZ CLIKSEAL STRL BL (MISCELLANEOUS) ×4 IMPLANT
COVER BACK TABLE 24X17X13 BIG (DRAPES) IMPLANT
COVER TABLE BACK 60X90 (DRAPES) ×2 IMPLANT
DRAPE C-ARM 42X72 X-RAY (DRAPES) ×4 IMPLANT
DRAPE LAPAROTOMY 100X72X124 (DRAPES) ×2 IMPLANT
DRAPE POUCH INSTRU U-SHP 10X18 (DRAPES) ×2 IMPLANT
DRAPE SURG 17X23 STRL (DRAPES) ×2 IMPLANT
DRESSING TELFA 8X3 (GAUZE/BANDAGES/DRESSINGS) ×2 IMPLANT
DRSG OPSITE 4X5.5 SM (GAUZE/BANDAGES/DRESSINGS) ×2 IMPLANT
DURAPREP 26ML APPLICATOR (WOUND CARE) ×2 IMPLANT
ELECT REM PT RETURN 9FT ADLT (ELECTROSURGICAL) ×2
ELECTRODE REM PT RTRN 9FT ADLT (ELECTROSURGICAL) ×1 IMPLANT
EVACUATOR 1/8 PVC DRAIN (DRAIN) ×4 IMPLANT
GAUZE SPONGE 4X4 16PLY XRAY LF (GAUZE/BANDAGES/DRESSINGS) IMPLANT
GLOVE BIO SURGEON STRL SZ8 (GLOVE) ×6 IMPLANT
GLOVE BIOGEL PI IND STRL 7.0 (GLOVE) ×2 IMPLANT
GLOVE BIOGEL PI IND STRL 8.5 (GLOVE) ×1 IMPLANT
GLOVE BIOGEL PI INDICATOR 7.0 (GLOVE) ×2
GLOVE BIOGEL PI INDICATOR 8.5 (GLOVE) ×1
GLOVE SURG SS PI 6.5 STRL IVOR (GLOVE) ×6 IMPLANT
GOWN BRE IMP SLV AUR LG STRL (GOWN DISPOSABLE) ×6 IMPLANT
GOWN BRE IMP SLV AUR XL STRL (GOWN DISPOSABLE) ×4 IMPLANT
GOWN STRL REIN 2XL LVL4 (GOWN DISPOSABLE) ×2 IMPLANT
HEMOSTAT POWDER KIT SURGIFOAM (HEMOSTASIS) IMPLANT
KIT BASIN OR (CUSTOM PROCEDURE TRAY) ×2 IMPLANT
KIT ROOM TURNOVER OR (KITS) ×2 IMPLANT
MILL MEDIUM DISP (BLADE) ×2 IMPLANT
NEEDLE BONE MARROW 8GAX6 (NEEDLE) ×2 IMPLANT
NEEDLE HYPO 25X1 1.5 SAFETY (NEEDLE) ×2 IMPLANT
NS IRRIG 1000ML POUR BTL (IV SOLUTION) ×2 IMPLANT
PACK LAMINECTOMY NEURO (CUSTOM PROCEDURE TRAY) ×2 IMPLANT
PAD ARMBOARD 7.5X6 YLW CONV (MISCELLANEOUS) ×6 IMPLANT
ROD DANEK 40MM (Rod) ×4 IMPLANT
SCREW PEDICLE VA L635 5.5X45M (Screw) ×4 IMPLANT
SCREW SET NON BREAK OFF (Screw) ×8 IMPLANT
SPONGE LAP 4X18 X RAY DECT (DISPOSABLE) IMPLANT
SPONGE SURGIFOAM ABS GEL 100 (HEMOSTASIS) ×2 IMPLANT
STRIP CLOSURE SKIN 1/2X4 (GAUZE/BANDAGES/DRESSINGS) ×2 IMPLANT
STRIP VITOSS 25X100X4MM (Neuro Prosthesis/Implant) ×2 IMPLANT
SUT VIC AB 0 CT1 18XCR BRD8 (SUTURE) ×1 IMPLANT
SUT VIC AB 0 CT1 8-18 (SUTURE) ×2
SUT VIC AB 2-0 CP2 18 (SUTURE) ×2 IMPLANT
SUT VIC AB 3-0 SH 8-18 (SUTURE) ×4 IMPLANT
SYR 20ML ECCENTRIC (SYRINGE) ×2 IMPLANT
TOWEL OR 17X24 6PK STRL BLUE (TOWEL DISPOSABLE) ×2 IMPLANT
TOWEL OR 17X26 10 PK STRL BLUE (TOWEL DISPOSABLE) ×2 IMPLANT
TRAP SPECIMEN MUCOUS 40CC (MISCELLANEOUS) IMPLANT
TRAY FOLEY CATH 14FRSI W/METER (CATHETERS) ×2 IMPLANT
WATER STERILE IRR 1000ML POUR (IV SOLUTION) ×2 IMPLANT

## 2011-09-04 NOTE — Op Note (Signed)
09/04/2011  3:20 PM  PATIENT:  Maria Lloyd  66 y.o. 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 Decompressive lumbar laminectomy L2-3 requiring more work than would be required of the typical PLIF procedure in order to adequately decompress the neural elements.  2. Posterior lumbar interbody fusion L2-3 using 8 mm PEEK interbody cages packed with morcellized allograft and autograft.  3. Posterior fixation L2-3 using Sofamor Danek pedicle screws.  4. Intertransverse arthrodesis L2-3 using morcellized autograft and allograft. 5. removal of hardware L3-4 6. harvesting of bone marrow aspirate from the iliac crest through a separate fascial incision  SURGEON:  Marikay Alar, MD  ASSISTANTS: Dr. Gerlene Fee  ANESTHESIA:  General  EBL: 450 ml  Total I/O In: 2550 [I.V.:2000; Blood:150; IV Piggyback:400] Out: 1025 [Urine:575; Blood:450]  BLOOD ADMINISTERED:none  DRAINS: Hemovac   INDICATION FOR PROCEDURE: This patient underwent a previous lumbar fusion at L3-4. He developed back and leg pain. R. I showed spondylolisthesis with spinal stenosis L2-3 adjacent to her previous fusion. Recommended a PLIF at L2-3. Patient understood the risks, benefits, and alternatives and potential outcomes and wished to proceed.  PROCEDURE DETAILS:  The patient was brought to the operating room. After induction of generalized endotracheal anesthesia the patient was rolled into the prone position on chest rolls and all pressure points were padded. The patient's lumbar region was cleaned and then prepped with DuraPrep and draped in the usual sterile fashion. Anesthesia was injected and then a dorsal midline incision was made and carried down to the lumbosacral fascia. The fascia was opened and the paraspinous musculature was taken down in a subperiosteal fashion to expose L2-3 as well as a previous  L3-4 hardware. I removed the locking caps from the previous hardware and removed the rods as well as the L4 pedicle screws. Intraoperative fluoroscopy confirmed my level, and the spinous process of L2 was removed and complete lumbar laminectomies, hemi- facetectomies, and foraminotomies were performed at L2-3. The yellow ligament was removed to expose the underlying dura and nerve roots, and generous foraminotomies were performed to adequately decompress the neural elements. Once the decompression was complete, I turned my attention to the posterior lower lumbar interbody fusion. The epidural venous vasculature was coagulated and cut sharply. Disc space was incised and the initial discectomy was performed with pituitary rongeurs. The disc space was distracted with sequential distractors to a height of 8 mm. We then used a series of scrapers and shavers to prepare the endplates for fusion. The midline was prepared with Epstein curettes. Once the complete discectomy was finished, we packed an appropriate sized peek interbody cage with local autograft and morcellized allograft, gently retracted the nerve root, and tapped the cage into position at L2-3. We also tapped a similar cage into the opposite side. The midline was packed with morselized autograft and allograft. We then turned our attention to the posterior fixation. The pedicle screw entry zones were identified utilizing surface landmarks and fluoroscopy. We probed each pedicle with the pedicle probe and tapped each pedicle with the appropriate tap. We palpated with a ball probe to assure no break in the cortex. We then placed 5 5 x 45 mm pedicle screws into the pedicles bilaterally at L2. We then decorticated the transverse processes and laid a mixture of morcellized autograft and allograft out over these to perform intertransverse arthrodesis at L2-3. We then placed lordotic rods into  the multiaxial screw heads of the pedicle screws and locked these in  position with the locking caps and anti-torque device. We achieved compression of our grafts. We then checked our construct with AP and lateral fluoroscopy. Irrigated with copious amounts of bacitracin-containing saline solution. Placed a medium Hemovac drain through separate stab incision. Inspected the nerve roots once again to assure adequate decompression, lined to the dura with Gelfoam, and closed the muscle and the fascia with 0 Vicryl. Closed the subcutaneous tissues with 2-0 Vicryl and subcuticular tissues with 3-0 Vicryl. The skin was closed with benzoin and Steri-Strips. Dressing was then applied, the patient was awakened from general anesthesia and transported to the recovery room in stable condition. At the end of the procedure all sponge, needle and instrument counts were correct.   PLAN OF CARE: Admit to inpatient   PATIENT DISPOSITION:  PACU - hemodynamically stable.   Delay start of Pharmacological VTE agent (>24hrs) due to surgical blood loss or risk of bleeding:  yes

## 2011-09-04 NOTE — Anesthesia Procedure Notes (Signed)
Procedure Name: Intubation Date/Time: 09/04/2011 11:44 AM Performed by: Leona Singleton A Pre-anesthesia Checklist: Patient identified Patient Re-evaluated:Patient Re-evaluated prior to inductionOxygen Delivery Method: Circle system utilized Preoxygenation: Pre-oxygenation with 100% oxygen Intubation Type: IV induction Ventilation: Mask ventilation without difficulty Laryngoscope Size: Miller and 2 Grade View: Grade I Tube type: Oral Tube size: 7.0 mm Number of attempts: 1 Airway Equipment and Method: Stylet and LTA kit utilized Placement Confirmation: ETT inserted through vocal cords under direct vision,  positive ETCO2 and breath sounds checked- equal and bilateral Secured at: 22 cm Tube secured with: Tape Dental Injury: Teeth and Oropharynx as per pre-operative assessment

## 2011-09-04 NOTE — Anesthesia Postprocedure Evaluation (Signed)
  Anesthesia Post-op Note  Patient: Maria Lloyd  Procedure(s) Performed: Procedure(s) (LRB): POSTERIOR LUMBAR FUSION 1 WITH HARDWARE REMOVAL (N/A)  Patient Location: PACU  Anesthesia Type: General  Level of Consciousness: awake  Airway and Oxygen Therapy: Patient Spontanous Breathing  Post-op Pain: mild  Post-op Assessment: Post-op Vital signs reviewed  Post-op Vital Signs: stable  Complications: No apparent anesthesia complications

## 2011-09-04 NOTE — Progress Notes (Signed)
When helping patient over to bed leakage noted at hemovac site reinforced with 4x4's and tape ,

## 2011-09-04 NOTE — Preoperative (Signed)
Beta Blockers   Reason not to administer Beta Blockers:Not Applicable 

## 2011-09-04 NOTE — H&P (Signed)
Subjective: Patient is a 66 y.o. female admitted for plif. Onset of symptoms was several weeks ago, gradually worsening since that time.  The pain is rated severe, and is located at the across the lower back and radiates to LEGS. The pain is described as aching and occurs intermittently. The symptoms have been progressive. Symptoms are exacerbated by exercise. MRI or CT showed adjacent level stenosis L2-3.   Past Medical History  Diagnosis Date  . Depression   . Arthritis   . GERD (gastroesophageal reflux disease)   . Diverticulosis 2012    Colonoscopy  . Internal hemorrhoids 2012     Colonoscopy   . Colon polyps 2012    Colonoscopy-Hyperplastic and Tubular Adenoma     Past Surgical History  Procedure Date  . Back surgery 05/2005 &01/2010    spondylolisthesis  . Total abdominal hysterectomy w/ bilateral salpingoophorectomy 09/1997  . Eye surgery     lasik    Prior to Admission medications   Medication Sig Start Date End Date Taking? Authorizing Provider  buPROPion (WELLBUTRIN XL) 300 MG 24 hr tablet Take 300 mg by mouth every morning. 05/23/11 05/22/12 Yes Bruce Romilda Garret, MD  cholecalciferol (VITAMIN D) 1000 UNITS tablet Take 1,000 Units by mouth daily.   Yes Historical Provider, MD  estradiol (VIVELLE-DOT) 0.0375 MG/24HR Place 1 patch onto the skin 2 (two) times a week. On Sunday and Thursday   Yes Historical Provider, MD  meloxicam (MOBIC) 7.5 MG tablet Take 7.5 mg by mouth Twice daily. 07/09/11  Yes Historical Provider, MD  metaxalone (SKELAXIN) 800 MG tablet Take 1 tablet by mouth Once daily as needed. For spasms 06/24/11  Yes Historical Provider, MD  Misc Natural Products (OSTEO BI-FLEX ADV DOUBLE ST PO) Take 1 tablet by mouth 2 (two) times daily.    Yes Historical Provider, MD  Multiple Vitamin (MULTIVITAMIN WITH MINERALS) TABS Take 1 tablet by mouth daily.   Yes Historical Provider, MD  OMEGA 3 1200 MG CAPS Take 1 capsule by mouth daily.    Yes Historical Provider, MD  traMADol  (ULTRAM) 50 MG tablet Take 1-2 tablets by mouth Three times daily as needed. For pain 06/24/11  Yes Historical Provider, MD   No Known Allergies  History  Substance Use Topics  . Smoking status: Never Smoker   . Smokeless tobacco: Never Used  . Alcohol Use: 0.0 oz/week     2 drinks a week     Family History  Problem Relation Age of Onset  . Stroke Mother   . Lung cancer Mother   . Prostate cancer Father   . Colon cancer Neg Hx      Review of Systems  Positive ROS: neg  All other systems have been reviewed and were otherwise negative with the exception of those mentioned in the HPI and as above.  Objective: Vital signs in last 24 hours: Temp:  [97.9 F (36.6 C)] 97.9 F (36.6 C) (08/08 0857) Pulse Rate:  [71] 71  (08/08 0857) Resp:  [18] 18  (08/08 0857) BP: (128)/(77) 128/77 mmHg (08/08 0857) SpO2:  [100 %] 100 % (08/08 0857)  General Appearance: Alert, cooperative, no distress, appears stated age Head: Normocephalic, without obvious abnormality, atraumatic Eyes: PERRL, conjunctiva/corneas clear, EOM's intact, fundi benign, both eyes      Ears: Normal TM's and external ear canals, both ears Throat: Lips, mucosa, and tongue normal; teeth and gums normal Neck: Supple, symmetrical, trachea midline, no adenopathy; thyroid: No enlargement/tenderness/nodules; no carotid bruit or JVD Back:  Symmetric, no curvature, ROM normal, no CVA tenderness Lungs: Clear to auscultation bilaterally, respirations unlabored Heart: Regular rate and rhythm, S1 and S2 normal, no murmur, rub or gallop Abdomen: Soft, non-tender, bowel sounds active all four quadrants, no masses, no organomegaly Extremities: Extremities normal, atraumatic, no cyanosis or edema Pulses: 2+ and symmetric all extremities Skin: Skin color, texture, turgor normal, no rashes or lesions  NEUROLOGIC:   Mental status: Alert and oriented x4,  no aphasia, good attention span, fund of knowledge, and memory Motor Exam -  grossly normal Sensory Exam - grossly normal Reflexes: 1+ Coordination - grossly normal Gait - grossly normal Balance - grossly normal Cranial Nerves: I: smell Not tested  II: visual acuity  OS: nl    OD: nl  II: visual fields Full to confrontation  II: pupils Equal, round, reactive to light  III,VII: ptosis None  III,IV,VI: extraocular muscles  Full ROM  V: mastication Normal  V: facial light touch sensation  Normal  V,VII: corneal reflex  Present  VII: facial muscle function - upper  Normal  VII: facial muscle function - lower Normal  VIII: hearing Not tested  IX: soft palate elevation  Normal  IX,X: gag reflex Present  XI: trapezius strength  5/5  XI: sternocleidomastoid strength 5/5  XI: neck flexion strength  5/5  XII: tongue strength  Normal    Data Review Lab Results  Component Value Date   WBC 4.7 08/21/2011   HGB 13.8 08/21/2011   HCT 39.7 08/21/2011   MCV 90.4 08/21/2011   PLT 241 08/21/2011   Lab Results  Component Value Date   NA 139 08/21/2011   K 4.0 08/21/2011   CL 103 08/21/2011   CO2 26 08/21/2011   BUN 13 08/21/2011   CREATININE 0.78 08/21/2011   GLUCOSE 70 08/21/2011   Lab Results  Component Value Date   INR 1.09 08/21/2011    Assessment/Plan: Patient admitted for plif l2-3. Patient has failed conservative therapy.  I explained the condition and procedure to the patient and answered any questions.  Patient wishes to proceed with procedure as planned. Understands risks/ benefits and typical outcomes of procedure.   Maria Lloyd S 09/04/2011 9:10 AM

## 2011-09-04 NOTE — Anesthesia Preprocedure Evaluation (Addendum)
Anesthesia Evaluation  Patient identified by MRN, date of birth, ID band Patient awake    Reviewed: Allergy & Precautions, H&P , NPO status , Patient's Chart, lab work & pertinent test results  History of Anesthesia Complications Negative for: history of anesthetic complications  Airway Mallampati: I TM Distance: >3 FB Neck ROM: Full    Dental No notable dental hx. (+) Teeth Intact and Dental Advisory Given   Pulmonary neg pulmonary ROS,  breath sounds clear to auscultation  Pulmonary exam normal       Cardiovascular negative cardio ROS  Rhythm:Regular Rate:Normal  Hyperlipidemia    Neuro/Psych PSYCHIATRIC DISORDERS Depression  Neuromuscular disease (chronic back pain; LLE>RLE)    GI/Hepatic Neg liver ROS, GERD-  Controlled,Dysphagia    Endo/Other  negative endocrine ROS  Renal/GU negative Renal ROS     Musculoskeletal  (+) Arthritis -, Osteoarthritis,    Abdominal   Peds negative pediatric ROS (+)  Hematology negative hematology ROS (+)   Anesthesia Other Findings   Reproductive/Obstetrics negative OB ROS                       Anesthesia Physical Anesthesia Plan  ASA: II  Anesthesia Plan: General   Post-op Pain Management:    Induction: Intravenous  Airway Management Planned: Oral ETT  Additional Equipment:   Intra-op Plan:   Post-operative Plan: Extubation in OR  Informed Consent: I have reviewed the patients History and Physical, chart, labs and discussed the procedure including the risks, benefits and alternatives for the proposed anesthesia with the patient or authorized representative who has indicated his/her understanding and acceptance.   Dental advisory given  Plan Discussed with: CRNA and Surgeon  Anesthesia Plan Comments: (Plan routine monitors, GETA)        Anesthesia Quick Evaluation

## 2011-09-04 NOTE — Transfer of Care (Signed)
Immediate Anesthesia Transfer of Care Note  Patient: Maria Lloyd  Procedure(s) Performed: Procedure(s) (LRB): POSTERIOR LUMBAR FUSION 1 WITH HARDWARE REMOVAL (N/A)  Patient Location: PACU  Anesthesia Type: General  Level of Consciousness: awake, alert , oriented and patient cooperative  Airway & Oxygen Therapy: Patient Spontanous Breathing and Patient connected to face mask oxygen  Post-op Assessment: Report given to PACU RN, Post -op Vital signs reviewed and stable and Patient moving all extremities X 4  Post vital signs: Reviewed and stable  Complications: No apparent anesthesia complications

## 2011-09-05 ENCOUNTER — Encounter (HOSPITAL_COMMUNITY): Payer: Self-pay | Admitting: General Practice

## 2011-09-05 MED ORDER — PANTOPRAZOLE SODIUM 40 MG PO TBEC
40.0000 mg | DELAYED_RELEASE_TABLET | Freq: Every day | ORAL | Status: DC
  Administered 2011-09-05: 40 mg via ORAL
  Filled 2011-09-05: qty 1

## 2011-09-05 MED FILL — Heparin Sodium (Porcine) Inj 1000 Unit/ML: INTRAMUSCULAR | Qty: 30 | Status: AC

## 2011-09-05 MED FILL — Sodium Chloride IV Soln 0.9%: INTRAVENOUS | Qty: 1000 | Status: AC

## 2011-09-05 MED FILL — Sodium Chloride Irrigation Soln 0.9%: Qty: 3000 | Status: AC

## 2011-09-05 NOTE — Evaluation (Signed)
I have read and agree with the below assessment and plan.   Mallery Harshman Helen Whitlow PT, DPT Pager: [phone] 

## 2011-09-05 NOTE — Progress Notes (Signed)
RN notified of abnormal BP

## 2011-09-05 NOTE — Progress Notes (Signed)
RN was notified of abnormal BP  

## 2011-09-05 NOTE — Progress Notes (Signed)
Occupational Therapy Evaluation Patient Details Name: Lloyd Maria MRN: [id number] DOB: November 28, 1945 Today's Date: 09/05/2011 Time: 1040-1101 OT Time Calculation (min): 21 min  OT Assessment / Plan / Recommendation Clinical Impression  Pt s/p PLIF thus affecting PLOF.  Educated pt on back precautions and safe techniques when performing ADLs and functional mobility.  Pt at supervision level at this time.  All education completed.  At this time, no further acute OT needs. Will have necessary level of assist at home.    OT Assessment  Patient does not need any further OT services    Follow Up Recommendations  Supervision/Assistance - 24 hour;No OT follow up    Barriers to Discharge      Equipment Recommendations  None recommended by OT    Recommendations for Other Services    Frequency       Precautions / Restrictions Precautions Precautions: Back Precaution Booklet Issued: Yes (comment) Precaution Comments: Educated pt on 3/3 back precautions. Required Braces or Orthoses: Spinal Brace Spinal Brace: Lumbar corset;Applied in sitting position   Pertinent Vitals/Pain See vitals    ADL  Grooming: Performed;Wash/dry hands;Supervision/safety Where Assessed - Grooming: Unsupported standing Lower Body Bathing: Simulated;Supervision/safety Where Assessed - Lower Body Bathing: Unsupported sitting Lower Body Dressing: Performed;Supervision/safety Where Assessed - Lower Body Dressing: Unsupported sitting Toilet Transfer: Performed;Supervision/safety Toilet Transfer Method: Sit to stand (ambulating) Acupuncturist: Comfort height toilet Toileting - Clothing Manipulation and Hygiene: Performed;Supervision/safety Where Assessed - Engineer, mining and Hygiene: Sit to stand from 3-in-1 or toilet Equipment Used: Back brace;Gait belt Transfers/Ambulation Related to ADLs: Pt throughout room with close supervision for safety. ADL Comments: Educated pt on ADL  techniques to assist with maintaining back precautions. Pt able to cross ankles over knees to perform LB bathing/dressing.  Recommended to pt to use built in shower seat at home during bathing.  Upon OT arrival, pt reclined in chair without back brace. Educated pt on proper positioning in straight back chair to protect back (avoid arching) and to have brace on at all times when OOB. Pt verbalized understanding. Independent with donning back brace.    OT Diagnosis:    OT Problem List:   OT Treatment Interventions:     OT Goals    Visit Information  Last OT Received On: 09/05/11 Assistance Needed: +1    Subjective Data      Prior Functioning  Vision/Perception  Home Living Lives With: Spouse Available Help at Discharge: Family;Available 24 hours/day Type of Home: House Home Access: Stairs to enter [employer] of Steps: 1 Entrance Stairs-Rails: None Home Layout: One level Bathroom Shower/Tub: Health visitor: Handicapped height Home Adaptive Equipment: Environmental consultant - rolling;Reacher;Built-in shower seat Prior Function Level of Independence: Independent      Cognition  Overall Cognitive Status: Appears within functional limits for tasks assessed/performed Arousal/Alertness: Awake/alert Orientation Level: Appears intact for tasks assessed Behavior During Session: [facility] for tasks performed    Extremity/Trunk Assessment Right Upper Extremity Assessment RUE ROM/Strength/Tone: Within functional levels Left Upper Extremity Assessment LUE ROM/Strength/Tone: Within functional levels   Mobility Bed Mobility Bed Mobility: Not assessed Transfers Transfers: Sit to Stand;Stand to Sit Sit to Stand: 5: Supervision;From chair/3-in-1;From toilet;With upper extremity assist;With armrests Stand to Sit: 5: Supervision;To chair/3-in-1;To toilet;With armrests;With upper extremity assist Details for Transfer Assistance: Supervision for safety. Pt demonstrated safe hand  placement.   Exercise    Balance    End of Session OT - End of Session Equipment Utilized During Treatment: Back brace Activity Tolerance: Patient  tolerated treatment well Patient left: in chair;with call bell/phone within reach (with PT) Nurse Communication: Mobility status  GO    09/05/2011 Cipriano Mile OTR/L Pager [phone] Office [phone]  Cipriano Mile 09/05/2011, 11:45 AM

## 2011-09-05 NOTE — Evaluation (Signed)
Physical Therapy Evaluation Patient Details Name: Maria Lloyd MRN: [id number] DOB: 04-29-1945 Today's Date: 09/05/2011 Time: 4098-1191 PT Time Calculation (min): 14 min  PT Assessment / Plan / Recommendation Clinical Impression  Pt is a 66 yo female s/p lumbar fusion post op day 1. Pt presents to physical therpapy evaluation with decreased mobility secondary to surgery. Pt will benefit from skilled physical therapy in acute care setting to address decreased mobility to facillitate safe discharge home.    PT Assessment  Patient needs continued PT services    Follow Up Recommendations  Home health PT;Supervision for mobility/OOB (pending pt progress)    Barriers to Discharge        Equipment Recommendations  None recommended by PT (Pt reports having own walker.)    Recommendations for Other Services     Frequency Min 5X/week    Precautions / Restrictions Precautions Precautions: Back Precaution Booklet Issued: Yes (comment) Precaution Comments: Pt verbalized 3/3 back precautions. Required Braces or Orthoses: Spinal Brace Spinal Brace: Lumbar corset Restrictions Weight Bearing Restrictions: No         Mobility  Bed Mobility Bed Mobility: Not assessed Details for Bed Mobility Assistance: Pt up in chair upon presentation. Pt was educated about log rolling technique for bed mobility. Transfers Transfers: Sit to Stand;Stand to Sit Sit to Stand: 5: Supervision;From chair/3-in-1;With upper extremity assist Stand to Sit: 5: Supervision;With upper extremity assist;To chair/3-in-1 Details for Transfer Assistance: Pt required supervision for safety. Ambulation/Gait Ambulation/Gait Assistance: 4: Min guard;5: Supervision Ambulation Distance (Feet): 200 Feet Assistive device: Rolling walker Ambulation/Gait Assistance Details: Pt requried supevision while ambulating with RW. Once back in room, pt walked to chair (4ft) with min guard for safety. Gait Pattern: Step-through  pattern;Decreased stride length;Decreased trunk rotation;Trunk flexed Stairs: No    Exercises     PT Diagnosis: Difficulty walking;Abnormality of gait;Acute pain  PT Problem List: Decreased activity tolerance;Decreased mobility;Decreased knowledge of use of DME;Decreased knowledge of precautions;Decreased balance PT Treatment Interventions: DME instruction;Gait training;Stair training;Functional mobility training;Therapeutic activities;Therapeutic exercise;Balance training;Neuromuscular re-education;Patient/family education   PT Goals Acute Rehab PT Goals PT Goal Formulation: With patient Potential to Achieve Goals: Good Pt will Roll Supine to Right Side: with modified independence PT Goal: Rolling Supine to Right Side - Progress: Goal set today Pt will Roll Supine to Left Side: with modified independence PT Goal: Rolling Supine to Left Side - Progress: Goal set today Pt will go Sit to Supine/Side: with modified independence PT Goal: Sit to Supine/Side - Progress: Goal set today Pt will go Sit to Stand: with modified independence PT Goal: Sit to Stand - Progress: Goal set today Pt will go Stand to Sit: with modified independence PT Goal: Stand to Sit - Progress: Goal set today Pt will Ambulate: >150 feet;with modified independence;with least restrictive assistive device PT Goal: Ambulate - Progress: Goal set today Additional Goals Additional Goal #1: Pt will demonstrate understanding of back precautions during functional mobility and be able to verbalize 3/3 back precautions. PT Goal: Additional Goal #1 - Progress: Goal set today  Visit Information  Last PT Received On: 09/05/11 Assistance Needed: +1    Subjective Data  Subjective: "I think walking makes my back feel better."   Prior Functioning  Home Living Lives With: Spouse Available Help at Discharge: Family;Available 24 hours/day Type of Home: House Home Access: Stairs to enter [employer] of Steps:  1 Entrance Stairs-Rails: None Home Layout: One level Bathroom Shower/Tub: Health visitor: Handicapped height Home Adaptive Equipment: Environmental consultant - rolling;Reacher;Built-in  shower seat Prior Function Level of Independence: Independent    Cognition  Overall Cognitive Status: Appears within functional limits for tasks assessed/performed Arousal/Alertness: Awake/alert Orientation Level: Appears intact for tasks assessed Behavior During Session: [facility] for tasks performed    Extremity/Trunk Assessment Right Upper Extremity Assessment RUE ROM/Strength/Tone: [facility] for tasks assessed Left Upper Extremity Assessment LUE ROM/Strength/Tone: [facility] for tasks assessed Right Lower Extremity Assessment RLE ROM/Strength/Tone: [facility] for tasks assessed Left Lower Extremity Assessment LLE ROM/Strength/Tone: WFL for tasks assessed   Balance    End of Session PT - End of Session Equipment Utilized During Treatment: Gait belt;Back brace Activity Tolerance: Patient tolerated treatment well Patient left: in chair;with call bell/phone within reach Nurse Communication: Mobility status  GP     Maria Lloyd 09/05/2011, 1:30 PM

## 2011-09-05 NOTE — Progress Notes (Signed)
Patient ID: Maria Lloyd, female   DOB: 1945/06/23, 66 y.o.   MRN: [id number] Subjective: Patient reports appropriate low back soreness. No leg pain. Already OOB ambulating.   Objective: Vital signs in last 24 hours: Temp:  [97.3 F (36.3 C)-98.8 F (37.1 C)] 98 F (36.7 C) (08/09 0535) Pulse Rate:  [71-106] 82  (08/09 0535) Resp:  [9-20] 20  (08/09 0535) BP: (88-148)/(51-90) 91/53 mmHg (08/09 0535) SpO2:  [97 %-100 %] 99 % (08/09 0535) Weight:  [66.906 kg (147 lb 8 oz)] 66.906 kg (147 lb 8 oz) (08/08 1700)  Intake/Output from previous day: 08/08 0701 - 08/09 0700 In: 2913 [P.O.:360; I.V.:2003; Blood:150; IV Piggyback:400] Out: 1865 [Urine:1125; Drains:290; Blood:450] Intake/Output this shift: Total I/O In: 240 [P.O.:240] Out: -   Neurologic: Grossly normal  Lab Results: Lab Results  Component Value Date   WBC 4.7 08/21/2011   HGB 13.8 08/21/2011   HCT 39.7 08/21/2011   MCV 90.4 08/21/2011   PLT 241 08/21/2011   Lab Results  Component Value Date   INR 1.09 08/21/2011   BMET Lab Results  Component Value Date   NA 139 08/21/2011   K 4.0 08/21/2011   CL 103 08/21/2011   CO2 26 08/21/2011   GLUCOSE 70 08/21/2011   BUN 13 08/21/2011   CREATININE 0.78 08/21/2011   CALCIUM 9.3 08/21/2011    Studies/Results: Dg Lumbar Spine 2-3 Views  09/04/2011  *RADIOLOGY REPORT*  Clinical Data: Status post PLIF at L2-L3.  LUMBAR SPINE - 2-3 VIEW,DG C-ARM 61-120 MIN  Comparison: 06/24/2011.  Findings: Two intraoperative fluoroscopic spot films incompletely visualized the lumbar spine.  However, there are interbody grafts at the L3-L4 and L4-L5 levels, and the previously noted PLIF hardware at these levels has been removed.  There has been interval PLIF at L2-L3, with an interbody graft in place at the L2-L3 interspace.  Alignment appears anatomic.  IMPRESSION: 1.  Postoperative changes of PLIF at L2-L3 with removal of hardware at L3-L4, as above.  Original Report Authenticated By: Florencia Reasons, M.D.   Dg C-arm [phone] Min  09/04/2011  *RADIOLOGY REPORT*  Clinical Data: Status post PLIF at L2-L3.  LUMBAR SPINE - 2-3 VIEW,DG C-ARM 61-120 MIN  Comparison: 06/24/2011.  Findings: Two intraoperative fluoroscopic spot films incompletely visualized the lumbar spine.  However, there are interbody grafts at the L3-L4 and L4-L5 levels, and the previously noted PLIF hardware at these levels has been removed.  There has been interval PLIF at L2-L3, with an interbody graft in place at the L2-L3 interspace.  Alignment appears anatomic.  IMPRESSION: 1.  Postoperative changes of PLIF at L2-L3 with removal of hardware at L3-L4, as above.  Original Report Authenticated By: Florencia Reasons, M.D.    Assessment/Plan: Doing great. PT/OT. Home tomorrow?   LOS: 1 day    Janani Chamber S 09/05/2011, 8:34 AM

## 2011-09-05 NOTE — Progress Notes (Signed)
Initial review is complete. 

## 2011-09-06 MED ORDER — OXYCODONE-ACETAMINOPHEN 5-325 MG PO TABS: 1.0000 | ORAL_TABLET | ORAL | Status: AC | PRN

## 2011-09-06 NOTE — Progress Notes (Signed)
Physical Therapy Treatment Patient Details Name: Maria Lloyd MRN: [id number] DOB: [birth year] Today's Date: 09/06/2011 Time: 4098-1191 Maria Lloyd Time Calculation (min): 15 min  Maria Lloyd Assessment / Plan / Recommendation Comments on Treatment Session  Maria Lloyd has progressed very well. Is modified independent with all ambulation and transfers today. Maria Lloyd reports at times husband wont be at home, educated her on having the house set up for her when he leaves for the day so as to avoid lifting or difficult situations. Reports daughter will be able to come help if needed. No further Maria Lloyd needs at this time. Encouraged Maria Lloyd to continue ambulating with staff. Maria Lloyd signing off.     Follow Up Recommendations  No Maria Lloyd follow up;Supervision - Intermittent    Barriers to Discharge        Equipment Recommendations  None recommended by Maria Lloyd    Recommendations for Other Services    Frequency     Plan Discharge plan needs to be updated;All goals met and education completed, patient dischaged from Maria Lloyd services    Precautions / Restrictions Precautions Precautions: Back Precaution Comments: Maria Lloyd verbalized 3/3 back precautions independently Spinal Brace: Lumbar corset;Applied in sitting position       Mobility  Bed Mobility Details for Bed Mobility Assistance: not assessed but Maria Lloyd reports performing without physical assist this morning and verbalized rolling technique Transfers Transfers: Sit to Stand;Stand to Sit Sit to Stand: 6: Modified independent (Device/Increase time);From bed;With upper extremity assist Stand to Sit: 6: Modified independent (Device/Increase time);To chair/3-in-1;With upper extremity assist Details for Transfer Assistance: Maria Lloyd standing in bathroom upon presentation brushing her teeth, she was not wearing her brace so instructed Maria Lloyd to sit and don brace, Maria Lloyd able to don brace independently Ambulation/Gait Ambulation/Gait Assistance: 6: Modified independent (Device/Increase time) Ambulation  Distance (Feet): 400 Feet Assistive device: Rolling walker Ambulation/Gait Assistance Details: good speed and fluid gait Gait Pattern: Step-through pattern Stairs: Yes Stairs Assistance: 4: Min assist Stairs Assistance Details (indicate cue type and reason): Maria Lloyd without rails at home so used HHA, good technique and form, no instability Stair Management Technique: Other (comment) (HHA on the right) Number of Stairs: 3  (4 inch steps)      Maria Lloyd Goals Acute Rehab Maria Lloyd Goals Maria Lloyd Goal: Rolling Supine to Right Side - Progress: Partly met (Maria Lloyd verbalizes technique, not observed) Maria Lloyd Goal: Rolling Supine to Left Side - Progress: Partly met (Maria Lloyd verbalizes technique however not observed) Maria Lloyd Goal: Sit to Supine/Side - Progress: Partly met (Maria Lloyd verbalizes technique however not observed) Maria Lloyd Goal: Sit to Stand - Progress: Met Maria Lloyd Goal: Stand to Sit - Progress: Met Maria Lloyd Goal: Ambulate - Progress: Met Additional Goals Maria Lloyd Goal: Additional Goal #1 - Progress: Met  Visit Information  Last Maria Lloyd Received On: 09/06/11 Assistance Needed: +1    Subjective Data  Subjective: I guess I just forgot to put that on (with regards to back brace, Maria Lloyd standing in bathroom without it on)    Cognition  Overall Cognitive Status: Appears within functional limits for tasks assessed/performed Arousal/Alertness: Awake/alert Orientation Level: Appears intact for tasks assessed Behavior During Session: [facility] for tasks performed    Balance     End of Session Maria Lloyd - End of Session Equipment Utilized During Treatment: Gait belt Activity Tolerance: Patient tolerated treatment well Patient left: in chair;with call bell/phone within reach   GP     [facility] HELEN 09/06/2011, 8:23 AM

## 2011-09-06 NOTE — Progress Notes (Signed)
Subjective: Patient reports doing well  Objective: Vital signs in last 24 hours: Temp:  [97.5 F (36.4 C)-97.9 F (36.6 C)] 97.5 F (36.4 C) (08/10 0553) Pulse Rate:  [61-70] 66  (08/10 0553) Resp:  [18-20] 20  (08/10 0553) BP: (97-128)/(51-70) 128/59 mmHg (08/10 0553) SpO2:  [94 %-100 %] 94 % (08/10 0553)  Intake/Output from previous day: 08/09 0701 - 08/10 0700 In: 840 [P.O.:840] Out: 170 [Drains:170] Intake/Output this shift:    Physical Exam: Full strength, no numbness.  Dressing dry.  Hemovac in place.  Lab Results: No results found for this basename: WBC:2,HGB:2,HCT:2,PLT:2 in the last 72 hours BMET No results found for this basename: NA:2,K:2,CL:2,CO2:2,GLUCOSE:2,BUN:2,CREATININE:2,CALCIUM:2 in the last 72 hours  Studies/Results: Dg Lumbar Spine 2-3 Views  09/04/2011  *RADIOLOGY REPORT*  Clinical Data: Status post PLIF at L2-L3.  LUMBAR SPINE - 2-3 VIEW,DG C-ARM 61-120 MIN  Comparison: 06/24/2011.  Findings: Two intraoperative fluoroscopic spot films incompletely visualized the lumbar spine.  However, there are interbody grafts at the L3-L4 and L4-L5 levels, and the previously noted PLIF hardware at these levels has been removed.  There has been interval PLIF at L2-L3, with an interbody graft in place at the L2-L3 interspace.  Alignment appears anatomic.  IMPRESSION: 1.  Postoperative changes of PLIF at L2-L3 with removal of hardware at L3-L4, as above.  Original Report Authenticated By: Florencia Reasons, M.D.   Dg C-arm [phone] Min  09/04/2011  *RADIOLOGY REPORT*  Clinical Data: Status post PLIF at L2-L3.  LUMBAR SPINE - 2-3 VIEW,DG C-ARM 61-120 MIN  Comparison: 06/24/2011.  Findings: Two intraoperative fluoroscopic spot films incompletely visualized the lumbar spine.  However, there are interbody grafts at the L3-L4 and L4-L5 levels, and the previously noted PLIF hardware at these levels has been removed.  There has been interval PLIF at L2-L3, with an interbody graft in  place at the L2-L3 interspace.  Alignment appears anatomic.  IMPRESSION: 1.  Postoperative changes of PLIF at L2-L3 with removal of hardware at L3-L4, as above.  Original Report Authenticated By: Florencia Reasons, M.D.    Assessment/Plan: Doing well. D/C home.    LOS: 2 days    Dorian Heckle, MD 09/06/2011, 9:57 AM

## 2011-09-06 NOTE — Progress Notes (Signed)
Physical Therapy Discharge Patient Details Name: Maria Lloyd MRN: [id number] DOB: 07/29/1945 Today's Date: 09/06/2011 Time: 4098-1191 PT Time Calculation (min): 15 min  Patient discharged from PT services secondary to goals met and no further PT needs identified.  Please see latest therapy progress note for current level of functioning and progress toward goals.    Progress and discharge plan discussed with patient and/or caregiver: Patient/Caregiver agrees with plan  GP     Ivonne Andrew PT, DPT Pager: [phone] 09/06/2011, 8:23 AM

## 2011-09-06 NOTE — Discharge Summary (Signed)
Physician Discharge Summary  Patient ID: Maria Lloyd MRN: [id number] DOB/AGE: 10-19-1945 66 y.o.  Admit date: 09/04/2011 Discharge date: 09/06/2011  Admission Diagnoses:  Discharge Diagnoses:  Active Problems:  * No active hospital problems. *    Discharged Condition: good  Hospital Course: Uncomplicated lumbar decompression and fusion at L2/3 with prior fusion L3/4 and L4/5 levels  Consults: None  Significant Diagnostic Studies: None  Treatments: surgery: lumbar decompression and fusion at L2/3 with prior fusion L3/4 and L4/5 levels   Discharge Exam: Blood pressure 128/59, pulse 66, temperature 97.5 F (36.4 C), temperature source Oral, resp. rate 20, height 5\' 4"  (1.626 m), weight 66.906 kg (147 lb 8 oz), SpO2 94.00%. Neurologic: Alert and oriented X 3, normal strength and tone. Normal symmetric reflexes. Normal coordination and gait Wound:CDI  Disposition: Home   Medication List  As of 09/06/2011  9:58 AM   STOP taking these medications         meloxicam 7.5 MG tablet         TAKE these medications         buPROPion 300 MG 24 hr tablet   Commonly known as: WELLBUTRIN XL   Take 300 mg by mouth every morning.      cholecalciferol 1000 UNITS tablet   Commonly known as: VITAMIN D   Take 1,000 Units by mouth daily.      estradiol 0.0375 MG/24HR   Commonly known as: VIVELLE-DOT   Place 1 patch onto the skin 2 (two) times a week. On Sunday and Thursday      metaxalone 800 MG tablet   Commonly known as: SKELAXIN   Take 1 tablet by mouth Once daily as needed. For spasms      multivitamin with minerals Tabs   Take 1 tablet by mouth daily.      OMEGA 3 1200 MG Caps   Take 1 capsule by mouth daily.      OSTEO BI-FLEX ADV DOUBLE ST PO   Take 1 tablet by mouth 2 (two) times daily.      oxyCODONE-acetaminophen 5-325 MG per tablet   Commonly known as: PERCOCET/ROXICET   Take 1-2 tablets by mouth every 4 (four) hours as needed.      traMADol 50 MG tablet   Commonly known as: ULTRAM   Take 1-2 tablets by mouth Three times daily as needed. For pain             Signed: Dorian Heckle, MD 09/06/2011, 9:58 AM

## 2011-10-13 ENCOUNTER — Other Ambulatory Visit: Payer: Self-pay | Admitting: Neurological Surgery

## 2011-10-13 ENCOUNTER — Ambulatory Visit
Admission: RE | Admit: 2011-10-13 | Discharge: 2011-10-13 | Disposition: A | Discharge status: Home / Self Care | Payer: Managed Care, Other (non HMO) | Source: Ambulatory Visit | Attending: Neurological Surgery | Admitting: Neurological Surgery

## 2011-10-13 DIAGNOSIS — R079 Chest pain, unspecified: Secondary | ICD-10-CM

## 2011-10-13 MED ORDER — IOHEXOL 350 MG/ML SOLN
100.0000 mL | Freq: Once | INTRAVENOUS | Status: AC | PRN
  Administered 2011-10-13: 100 mL via INTRAVENOUS

## 2011-11-24 ENCOUNTER — Other Ambulatory Visit: Payer: Self-pay | Admitting: Internal Medicine

## 2011-12-03 ENCOUNTER — Telehealth: Payer: Self-pay | Admitting: Internal Medicine

## 2011-12-03 MED ORDER — BUPROPION HCL ER (XL) 300 MG PO TB24: 300.0000 mg | ORAL_TABLET | ORAL | Status: DC

## 2011-12-03 NOTE — Telephone Encounter (Signed)
Pt called and is almost completely out of buPROPion (WELLBUTRIN XL) 300 MG 24 hr tablet and is req this be called in for [facility] month supply [facility] on [address].  Pt req to be notified when done.

## 2011-12-03 NOTE — Telephone Encounter (Signed)
rx sent in electronically 

## 2011-12-11 ENCOUNTER — Other Ambulatory Visit: Payer: Self-pay | Admitting: Gynecology

## 2012-01-16 ENCOUNTER — Other Ambulatory Visit (INDEPENDENT_AMBULATORY_CARE_PROVIDER_SITE_OTHER): Payer: Managed Care, Other (non HMO)

## 2012-01-16 DIAGNOSIS — Z0000 Encounter for general adult medical examination without abnormal findings: Secondary | ICD-10-CM

## 2012-01-16 LAB — POCT URINALYSIS DIPSTICK
Bilirubin, UA: NEGATIVE
Leukocytes, UA: NEGATIVE
Nitrite, UA: NEGATIVE
Protein, UA: NEGATIVE
pH, UA: 7

## 2012-01-16 LAB — HEPATIC FUNCTION PANEL
ALT: 24 U/L (ref 0–35)
AST: 23 U/L (ref 0–37)
Albumin: 3.9 g/dL (ref 3.5–5.2)
Alkaline Phosphatase: 63 U/L (ref 39–117)

## 2012-01-16 LAB — CBC WITH DIFFERENTIAL/PLATELET
Basophils Absolute: 0.1 10*3/uL (ref 0.0–0.1)
Basophils Relative: 1.3 % (ref 0.0–3.0)
Eosinophils Relative: 4.4 % (ref 0.0–5.0)
HCT: 38.3 % (ref 36.0–46.0)
Hemoglobin: 12.8 g/dL (ref 12.0–15.0)
Lymphocytes Relative: 38.5 % (ref 12.0–46.0)
Lymphs Abs: 1.5 10*3/uL (ref 0.7–4.0)
Monocytes Relative: 11.8 % (ref 3.0–12.0)
Neutro Abs: 1.8 10*3/uL (ref 1.4–7.7)
RBC: 4.26 Mil/uL (ref 3.87–5.11)
RDW: 15.1 % — ABNORMAL HIGH (ref 11.5–14.6)
WBC: 4 10*3/uL — ABNORMAL LOW (ref 4.5–10.5)

## 2012-01-16 LAB — LIPID PANEL
HDL: 82.1 mg/dL (ref >39.00)
Total CHOL/HDL Ratio: 2
VLDL: 11 mg/dL (ref 0.0–40.0)

## 2012-01-16 LAB — BASIC METABOLIC PANEL
Calcium: 9.4 mg/dL (ref 8.4–10.5)
GFR: 70.6 mL/min (ref >60.00)
Glucose, Bld: 94 mg/dL (ref 70–99)
Potassium: 4.9 mEq/L (ref 3.5–5.1)
Sodium: 140 mEq/L (ref 135–145)

## 2012-01-16 LAB — TSH: TSH: 1.17 u[IU]/mL (ref 0.35–5.50)

## 2012-01-16 NOTE — Addendum Note (Signed)
Addended by: Bonnye Fava on: 01/16/2012 08:40 AM   Modules accepted: Orders

## 2012-01-23 ENCOUNTER — Encounter: Payer: Self-pay | Admitting: Internal Medicine

## 2012-01-23 ENCOUNTER — Ambulatory Visit (INDEPENDENT_AMBULATORY_CARE_PROVIDER_SITE_OTHER): Payer: Managed Care, Other (non HMO) | Admitting: Internal Medicine

## 2012-01-23 VITALS — BP 114/74 | HR 72 | Temp 97.8°F | Ht 64.0 in | Wt 152.0 lb

## 2012-01-23 DIAGNOSIS — Z0000 Encounter for general adult medical examination without abnormal findings: Secondary | ICD-10-CM

## 2012-01-23 MED ORDER — BUPROPION HCL ER (XL) 300 MG PO TB24: 300.0000 mg | ORAL_TABLET | ORAL | Status: DC

## 2012-01-23 NOTE — Progress Notes (Signed)
Patient ID: Maria Lloyd, female   DOB: [birth year], 66 y.o.   MRN: [id number] CPX Started decreasing bupropion. Feeling well  Past Medical History  Diagnosis Date  . Depression   . Arthritis   . GERD (gastroesophageal reflux disease)   . Diverticulosis 2012    Colonoscopy  . Internal hemorrhoids 2012     Colonoscopy   . Colon polyps 2012    Colonoscopy-Hyperplastic and Tubular Adenoma     History   Social History  . Marital Status: Married    Spouse Name: N/A    Number of Children: 2  . Years of Education: N/A   Occupational History  . 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 Smoking status: Never Smoker   . Smokeless tobacco: Never Used  . Alcohol Use: 0.0 oz/week     Comment: 2 drinks a week   . Drug Use: No  . Sexually Active: Not Currently    Birth Control/ Protection: Post-menopausal   Other Topics Concern  . Not on file   Social History Narrative   Daily caffeine     Past Surgical History  Procedure Date  . Back surgery 05/2005 &01/2010    spondylolisthesis  . Total abdominal hysterectomy w/ bilateral salpingoophorectomy 09/1997  . Eye surgery     lasik  . Laminectomy 09/04/2011    Family History  Problem Relation Age of Onset  . Stroke Mother   . Lung cancer Mother   . Prostate cancer Father   . Colon cancer Neg Hx     No Known Allergies  Current Outpatient Prescriptions on File Prior to Visit  Medication Sig Dispense Refill  . buPROPion (WELLBUTRIN XL) 300 MG 24 hr tablet Take 1 tablet (300 mg total) by mouth every morning.  30 tablet  1  . cholecalciferol (VITAMIN D) 1000 UNITS tablet Take 1,000 Units by mouth daily.      Marland Kitchen estradiol (VIVELLE-DOT) 0.0375 MG/24HR Place 1 patch onto the skin 2 (two) times a week. On Sunday and Thursday      . metaxalone (SKELAXIN) 800 MG tablet Take 1 tablet by mouth Once daily as needed. For spasms      . Misc Natural Products (OSTEO BI-FLEX ADV DOUBLE ST PO) Take 1 tablet by mouth 2 (two) times  daily.       . Multiple Vitamin (MULTIVITAMIN WITH MINERALS) TABS Take 1 tablet by mouth daily.      . OMEGA 3 1200 MG CAPS Take 1 capsule by mouth daily.       Marland Kitchen omeprazole (PRILOSEC) 20 MG capsule TAKE ONE CAPSULE BY MOUTH EVERY DAY  30 capsule  3  . traMADol (ULTRAM) 50 MG tablet Take 1-2 tablets by mouth Three times daily as needed. For pain         patient denies chest pain, shortness of breath, orthopnea. Denies lower extremity edema, abdominal pain, change in appetite, change in bowel movements. Patient denies rashes, musculoskeletal complaints. No other specific complaints in a complete review of systems.   BP 114/74  Pulse 72  Temp 97.8 F (36.6 C) (Oral)  Ht 5\' 4"  (1.626 m)  Wt 152 lb (68.947 kg)  BMI 26.09 kg/m2  Well-developed well-nourished female in no acute distress. HEENT exam atraumatic, normocephalic, extraocular muscles are intact. Neck is supple. No jugular venous distention no thyromegaly. Chest clear to auscultation without increased work of breathing. Cardiac exam S1 and S2 are regular. Abdominal exam active bowel sounds, soft, nontender. Extremities no  edema. Neurologic exam she is alert without any motor sensory deficits. Gait is normal.   Well Visit: health maint UTD

## 2012-04-15 ENCOUNTER — Telehealth: Payer: Self-pay | Admitting: Internal Medicine

## 2012-04-15 NOTE — Telephone Encounter (Signed)
Patient Information:  Caller Name: Kenda  Phone: [phone]  Patient: Maria Lloyd, Maria Lloyd  Gender: Female  DOB: 1945-03-08  Age: 67 Years  PCP: Birdie Sons (Adults only)  Office Follow Up:  Does the office need to follow up with this patient?: Yes  Instructions For The Office: Patient states she has required a steroid for Poison Ivy in the past. Patient requests a RX be called into [facility] on [address] at [phone]. Please return call to patient to advise her if a Rx has been called into Pharmacy. Patient can be reached at [phone].  RN Note:  Patient states she developed Poison Ivy on left arm, onset 04/12/12. Patient describes as raised, blistered, itchy areas. No drainage. Patient states sx occurred after pulling weeds. Care advice given per guidelines. Call back parameters reviewed. Patient verbalizes understanding. Patient states she has required a steroid for Poison Ivy in the past. Patient requests a RX be called into [facility] on [address] at [phone]. Please return call to patient to advise her if a Rx has been called into Pharmacy. Patient can be reached at [phone].  Symptoms  Reason For Call & Symptoms: Poison Ivy  Reviewed Health History In EMR: Yes  Reviewed Medications In EMR: Yes  Reviewed Allergies In EMR: Yes  Reviewed Surgeries / Procedures: Yes  Date of Onset of Symptoms: 04/12/2012  Guideline(s) Used:  Poison Ivy - Oak or [facility]  Disposition Per Guideline:   Home Care  Reason For Disposition Reached:   Poison [place], [place], or [place] with no complications  Advice Given:  Apply Cold to the Area:  Soak the involved area in cool water for 20 minutes or massage it with an ice cube as often as necessary to reduce itching and oozing.  Oral Antihistamine Medication for Itching:   Antihistamines may cause sleepiness. Do not drink, drive, or operate dangerous machinery while taking antihistamines.  Avoid Scratching:   Cut your fingernails  short and try not to scratch so as to prevent a secondary infection from bacteria.  Contagiousness:  Poison ivy or oak is not contagious to others.  Call Back If:  Rash lasts longer than 3 weeks  It looks infected  You become worse.  Expected Course:  Usually lasts 2 weeks. Treatment reduces the severity of the symptoms, not how long they last.  Patient Refused Recommendation:  Patient Requests Prescription  Patient states she has required a steroid for Poison Ivy in the past. Patient requests a RX be [facility] into [facility] on [address] at [phone]. Please return call to patient to advise her if a Rx has been called into Pharmacy. Patient can be reached at [phone].

## 2012-04-16 NOTE — Telephone Encounter (Signed)
Dr Cato Mulligan is out of the office, please schedule appt with any provider

## 2012-04-16 NOTE — Telephone Encounter (Signed)
LMOM for pt to call and schedule appt.

## 2012-04-16 NOTE — Telephone Encounter (Addendum)
Pt states she is better. Will call back Monday if any worse.

## 2012-04-19 NOTE — Telephone Encounter (Signed)
appt made

## 2012-04-20 ENCOUNTER — Ambulatory Visit (INDEPENDENT_AMBULATORY_CARE_PROVIDER_SITE_OTHER): Payer: Managed Care, Other (non HMO) | Admitting: Internal Medicine

## 2012-04-20 ENCOUNTER — Encounter: Payer: Self-pay | Admitting: Internal Medicine

## 2012-04-20 VITALS — BP 132/80 | HR 62 | Temp 97.5°F | Resp 18 | Wt 155.0 lb

## 2012-04-20 DIAGNOSIS — L259 Unspecified contact dermatitis, unspecified cause: Secondary | ICD-10-CM

## 2012-04-20 MED ORDER — METHYLPREDNISOLONE ACETATE 80 MG/ML IJ SUSP
80.0000 mg | Freq: Once | INTRAMUSCULAR | Status: AC
  Administered 2012-04-20: 80 mg via INTRAMUSCULAR

## 2012-04-20 MED ORDER — TRIAMCINOLONE ACETONIDE 0.1 % EX CREA: TOPICAL_CREAM | Freq: Two times a day (BID) | CUTANEOUS | Status: DC

## 2012-04-20 NOTE — Progress Notes (Signed)
Subjective:    Patient ID: Maria Lloyd, female    DOB: December 30, 1945, 67 y.o.   MRN: [id number]  HPI  67 year old patient who presents with a several day history of a very pruritic vesicular eruption most marked on the arms. She has had a number of episodes of contact dermatitis over the years and this is quite similar. She was doing yard work that preceded the dermatitis. She also has a few patchy areas over her abdominal wall region.  Past Medical History  Diagnosis Date  . Depression   . Arthritis   . GERD (gastroesophageal reflux disease)   . Diverticulosis 2012    Colonoscopy  . Internal hemorrhoids 2012     Colonoscopy   . Colon polyps 2012    Colonoscopy-Hyperplastic and Tubular Adenoma     History   Social History  . Marital Status: Married    Spouse Name: N/A    Number of Children: 2  . Years of Education: N/A   Occupational History  . 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 Smoking status: Never Smoker   . Smokeless tobacco: Never Used  . Alcohol Use: 0.6 oz/week    1 Glasses of wine per week     Comment: 2 drinks a week   . Drug Use: No  . Sexually Active: Not Currently    Birth Control/ Protection: Post-menopausal   Other Topics Concern  . Not on file   Social History Narrative   Daily caffeine     Past Surgical History  Procedure Laterality Date  . Back surgery  05/2005 &01/2010    spondylolisthesis  . Total abdominal hysterectomy w/ bilateral salpingoophorectomy  09/1997  . Eye surgery      lasik  . Laminectomy  09/04/2011    Family History  Problem Relation Age of Onset  . Stroke Mother   . Lung cancer Mother   . Prostate cancer Father   . Colon cancer Neg Hx     No Known Allergies  Current Outpatient Prescriptions on File Prior to Visit  Medication Sig Dispense Refill  . buPROPion (WELLBUTRIN XL) 300 MG 24 hr tablet Take 1 tablet (300 mg total) by mouth every morning.  90 tablet  3  . cholecalciferol (VITAMIN D) 1000  UNITS tablet Take 1,000 Units by mouth daily.      . Misc Natural Products (OSTEO BI-FLEX ADV DOUBLE ST PO) Take 1 tablet by mouth 2 (two) times daily.       . Multiple Vitamin (MULTIVITAMIN WITH MINERALS) TABS Take 1 tablet by mouth daily.      . OMEGA 3 1200 MG CAPS Take 1 capsule by mouth daily.       Marland Kitchen omeprazole (PRILOSEC) 20 MG capsule TAKE ONE CAPSULE BY MOUTH EVERY DAY  30 capsule  3  . traMADol (ULTRAM) 50 MG tablet Take 1-2 tablets by mouth Three times daily as needed. For pain       No current facility-administered medications on file prior to visit.    BP 132/80  Pulse 62  Temp(Src) 97.5 F (36.4 C) (Oral)  Resp 18  Wt 155 lb (70.308 kg)  BMI 26.59 kg/m2  SpO2 97%       Review of Systems  Skin: Positive for rash.       Objective:   Physical Exam  Constitutional: She appears well-developed and well-nourished. No distress.  Skin: Rash noted.  Scattered linear erythematous lesions with some crusting and scaling over primarily  the inner aspects of both arms. This is consistent with a resolving contact dermatitis          Assessment & Plan:   Content dermatitis  We'll treat with Depo-Medrol as well as topical triamcinolone

## 2012-04-20 NOTE — Patient Instructions (Signed)

## 2012-06-17 ENCOUNTER — Other Ambulatory Visit: Payer: Self-pay

## 2012-06-17 DIAGNOSIS — Z1231 Encounter for screening mammogram for malignant neoplasm of breast: Secondary | ICD-10-CM

## 2012-07-26 ENCOUNTER — Ambulatory Visit
Admission: RE | Admit: 2012-07-26 | Discharge: 2012-07-26 | Disposition: A | Discharge status: Home / Self Care | Payer: Managed Care, Other (non HMO) | Source: Ambulatory Visit

## 2012-07-26 DIAGNOSIS — Z1231 Encounter for screening mammogram for malignant neoplasm of breast: Secondary | ICD-10-CM

## 2012-11-08 ENCOUNTER — Encounter: Payer: Self-pay | Admitting: Internal Medicine

## 2012-11-08 ENCOUNTER — Ambulatory Visit (INDEPENDENT_AMBULATORY_CARE_PROVIDER_SITE_OTHER): Payer: Managed Care, Other (non HMO) | Admitting: Internal Medicine

## 2012-11-08 VITALS — BP 118/80 | HR 77 | Temp 97.9°F | Resp 20 | Wt 158.0 lb

## 2012-11-08 DIAGNOSIS — M1990 Unspecified osteoarthritis, unspecified site: Secondary | ICD-10-CM

## 2012-11-08 MED ORDER — HYDROCODONE-HOMATROPINE 5-1.5 MG/5ML PO SYRP: 5.0000 mL | ORAL_SOLUTION | Freq: Four times a day (QID) | ORAL | Status: AC | PRN

## 2012-11-08 NOTE — Progress Notes (Signed)
Subjective:    Patient ID: Maria Lloyd, female    DOB: 06-Aug-1945, 67 y.o.   MRN: [id number]  HPI  67 year old patient who presents with a three-day history of cough and congestion. She has a difficult cough that has interfered with sleep. Cough is nonproductive. There's been no fever chest pain or shortness of breath. Denies any purulent sputum production. Associated symptoms include sore throat and nasal congestion.  Past Medical History  Diagnosis Date  . Depression   . Arthritis   . GERD (gastroesophageal reflux disease)   . Diverticulosis 2012    Colonoscopy  . Internal hemorrhoids 2012     Colonoscopy   . Colon polyps 2012    Colonoscopy-Hyperplastic and Tubular Adenoma     History   Social History  . Marital Status: Married    Spouse Name: N/A    Number of Children: 2  . Years of Education: N/A   Occupational History  . 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 Smoking status: Never Smoker   . Smokeless tobacco: Never Used  . Alcohol Use: 0.6 oz/week    1 Glasses of wine per week     Comment: 2 drinks a week   . Drug Use: No  . Sexual Activity: Not Currently    Birth Control/ Protection: Post-menopausal   Other Topics Concern  . Not on file   Social History Narrative   Daily caffeine     Past Surgical History  Procedure Laterality Date  . Back surgery  05/2005 &01/2010    spondylolisthesis  . Total abdominal hysterectomy w/ bilateral salpingoophorectomy  09/1997  . Eye surgery      lasik  . Laminectomy  09/04/2011    Family History  Problem Relation Age of Onset  . Stroke Mother   . Lung cancer Mother   . Prostate cancer Father   . Colon cancer Neg Hx     No Known Allergies  Current Outpatient Prescriptions on File Prior to Visit  Medication Sig Dispense Refill  . buPROPion (WELLBUTRIN XL) 300 MG 24 hr tablet Take 1 tablet (300 mg total) by mouth every morning.  90 tablet  3  . cholecalciferol (VITAMIN D) 1000 UNITS tablet Take  1,000 Units by mouth daily.      . Misc Natural Products (OSTEO BI-FLEX ADV DOUBLE ST PO) Take 1 tablet by mouth 2 (two) times daily.       . Multiple Vitamin (MULTIVITAMIN WITH MINERALS) TABS Take 1 tablet by mouth daily.      . OMEGA 3 1200 MG CAPS Take 1 capsule by mouth daily.       Marland Kitchen oxyCODONE-acetaminophen (PERCOCET/ROXICET) 5-325 MG per tablet Take 1 tablet by mouth every 6 (six) hours as needed.       . traMADol (ULTRAM) 50 MG tablet Take 1-2 tablets by mouth Three times daily as needed. For pain      . triamcinolone cream (KENALOG) 0.1 % Apply topically 2 (two) times daily.  30 g  1   No current facility-administered medications on file prior to visit.    BP 118/80  Pulse 77  Temp(Src) 97.9 F (36.6 C) (Oral)  Resp 20  Wt 158 lb (71.668 kg)  BMI 27.11 kg/m2  SpO2 96%       Review of Systems  Constitutional: Positive for activity change, appetite change and fatigue.  HENT: Positive for sinus pressure. Negative for congestion, dental problem, hearing loss, rhinorrhea, sore throat and tinnitus.  Eyes: Negative for pain, discharge and visual disturbance.  Respiratory: Positive for cough. Negative for shortness of breath.   Cardiovascular: Negative for chest pain, palpitations and leg swelling.  Gastrointestinal: Negative for nausea, vomiting, abdominal pain, diarrhea, constipation, blood in stool and abdominal distention.  Genitourinary: Negative for dysuria, urgency, frequency, hematuria, flank pain, vaginal bleeding, vaginal discharge, difficulty urinating, vaginal pain and pelvic pain.  Musculoskeletal: Negative for arthralgias, gait problem and joint swelling.  Skin: Negative for rash.  Neurological: Negative for dizziness, syncope, speech difficulty, weakness, numbness and headaches.  Hematological: Negative for adenopathy.  Psychiatric/Behavioral: Negative for behavioral problems, dysphoric mood and agitation. The patient is not nervous/anxious.        Objective:    Physical Exam  Constitutional: She is oriented to person, place, and time. She appears well-developed and well-nourished.  HENT:  Head: Normocephalic.  Right Ear: External ear normal.  Left Ear: External ear normal.  Mouth/Throat: Oropharynx is clear and moist.  Eyes: Conjunctivae and EOM are normal. Pupils are equal, round, and reactive to light.  Neck: Normal range of motion. Neck supple. No thyromegaly present.  Cardiovascular: Normal rate, regular rhythm, normal heart sounds and intact distal pulses.   Pulmonary/Chest: Effort normal and breath sounds normal. No respiratory distress. She has no wheezes. She has no rales.  Abdominal: Soft. Bowel sounds are normal. She exhibits no mass. There is no tenderness.  Musculoskeletal: Normal range of motion.  Lymphadenopathy:    She has no cervical adenopathy.  Neurological: She is alert and oriented to person, place, and time.  Skin: Skin is warm and dry. No rash noted.  Psychiatric: She has a normal mood and affect. Her behavior is normal.          Assessment & Plan:   Vital URI with cough. We'll treat symptomatically Osteoarthritis stable

## 2012-11-08 NOTE — Patient Instructions (Signed)
Acute bronchitis symptoms for less than 10 days are generally not helped by antibiotics.  Take over-the-counter expectorants and cough medications such as  Mucinex DM.  Call if there is no improvement in 5 to 7 days or if he developed worsening cough, fever, or new symptoms, such as shortness of breath or chest pain.    

## 2013-01-17 ENCOUNTER — Other Ambulatory Visit: Payer: Self-pay | Admitting: Internal Medicine

## 2013-02-16 ENCOUNTER — Other Ambulatory Visit: Payer: Managed Care, Other (non HMO)

## 2013-02-23 ENCOUNTER — Encounter: Payer: Managed Care, Other (non HMO) | Admitting: Internal Medicine

## 2013-07-24 IMAGING — CT CT ANGIO CHEST
2 of 6 series · 17 of 36 positions shown · IV contrast ([ID] OMNI 350)
Comparison: Prior chest x-ray 08/21/2011

CLINICAL DATA: Chest pain, short of breath evaluate for PE

CT ANGIOGRAPHY CHEST
TECHNIQUE: Multidetector CT imaging of the chest using the
standard protocol during bolus administration of intravenous
contrast. Multiplanar reconstructed images including MIPs were
obtained and reviewed to evaluate the vascular anatomy.
Contrast: 100mL OMNIPAQUE IOHEXOL 350 MG/ML SOLN

[Series 4: pe 1.25 · axial · 0.70mm/px · z∈[-196,+10]mm · 11 of 199 slices shown]
[im 17/199  lung]
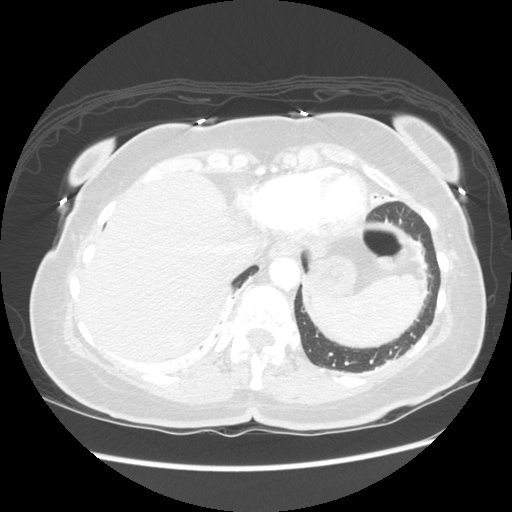
[im 34/199  mediastinal]
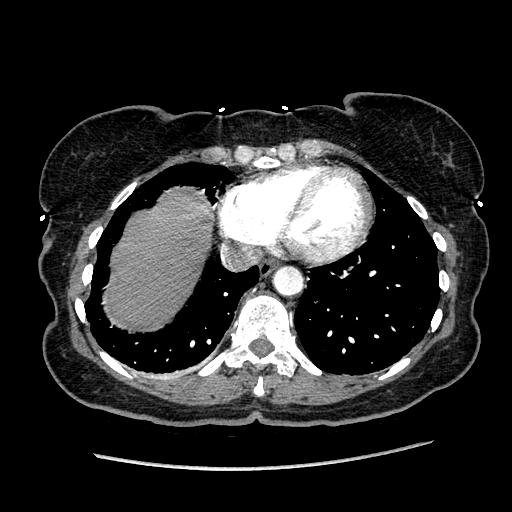
[im 50/199  lung]
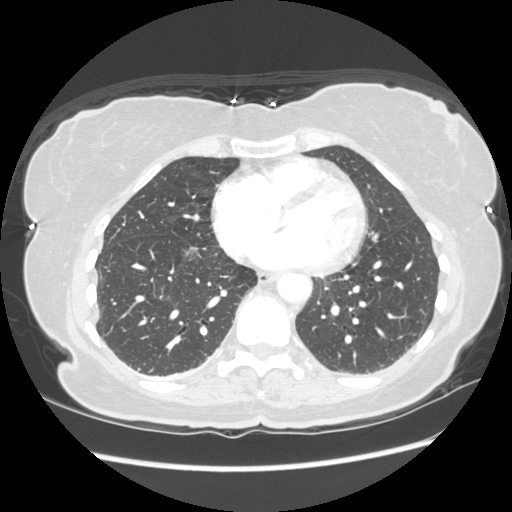
[im 67/199  mediastinal]
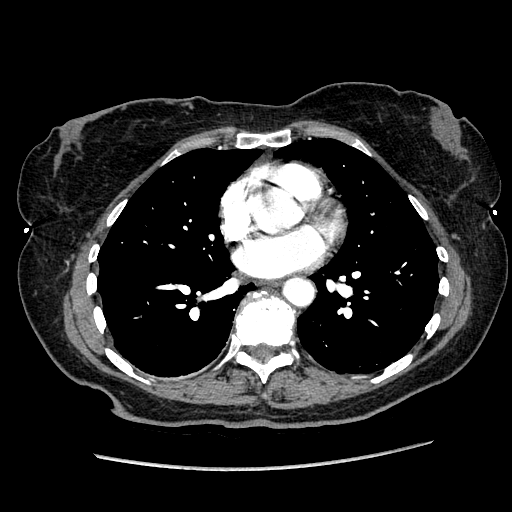
[im 83/199  lung]
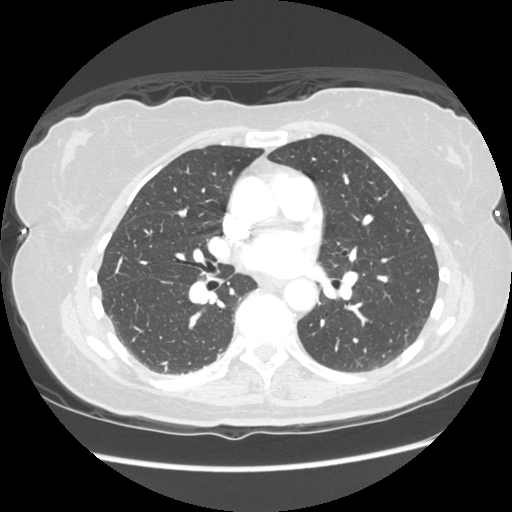
[im 100/199  mediastinal]
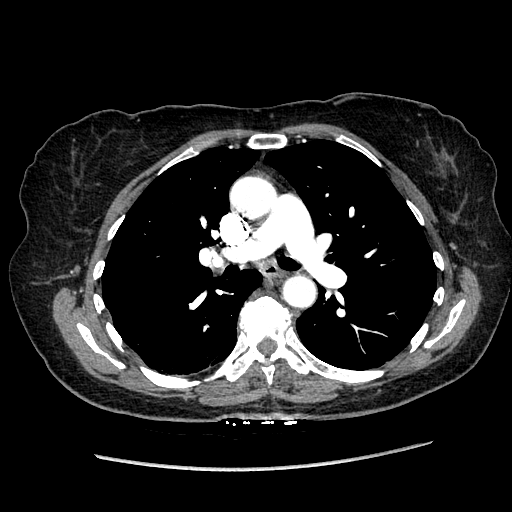
[im 116/199  lung]
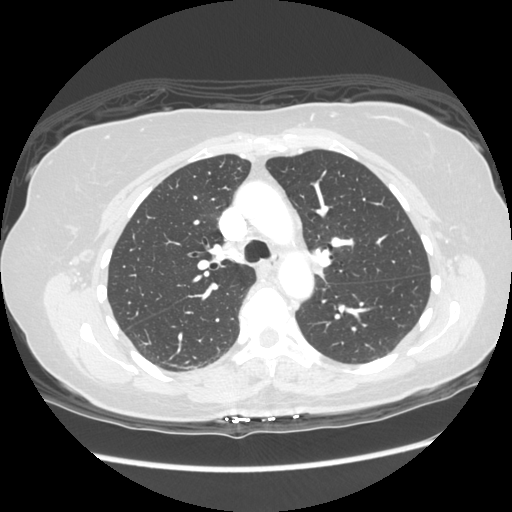
[im 133/199  mediastinal]
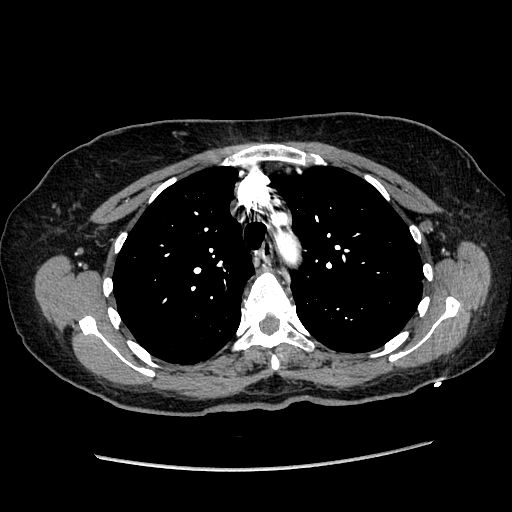
[im 149/199  lung]
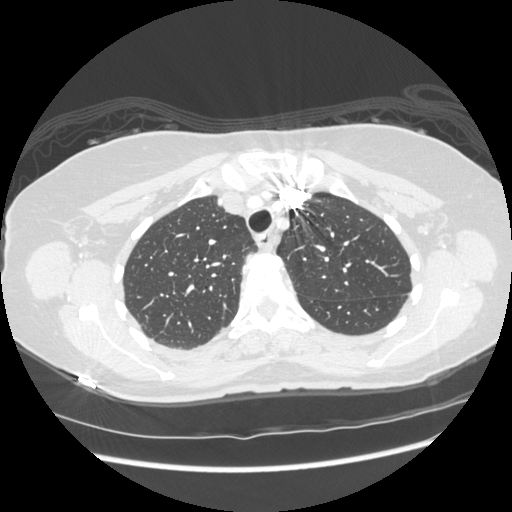
[im 166/199  mediastinal]
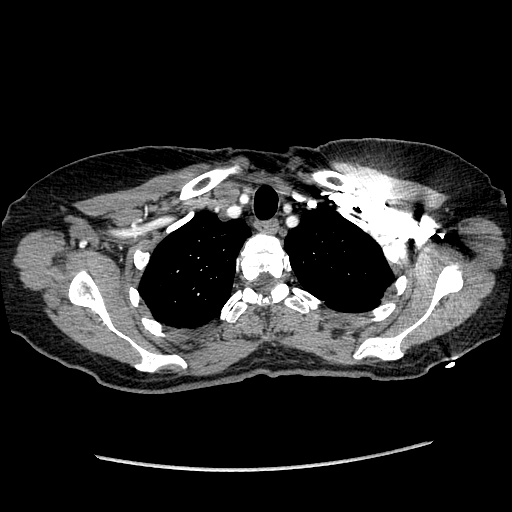
[im 182/199  lung]
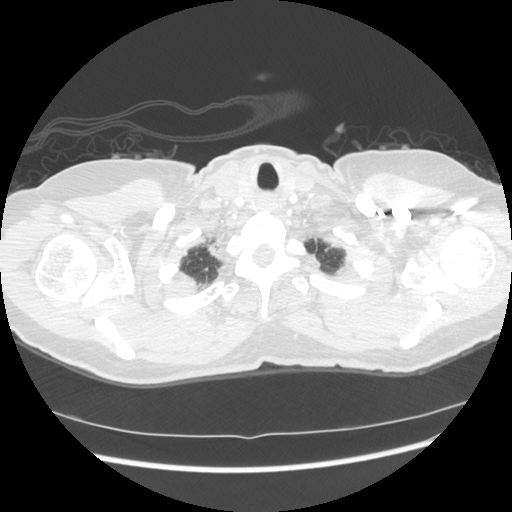

[Series 602: sagittal body · sagittal · 0.70mm/px · 6 of 145 slices shown]
[im 19/145  lung]
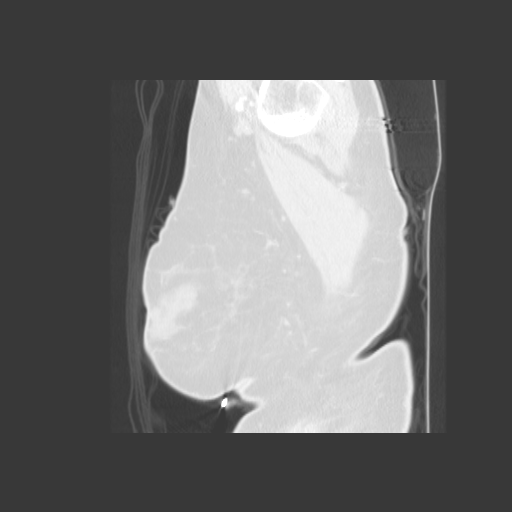
[im 37/145  lung]
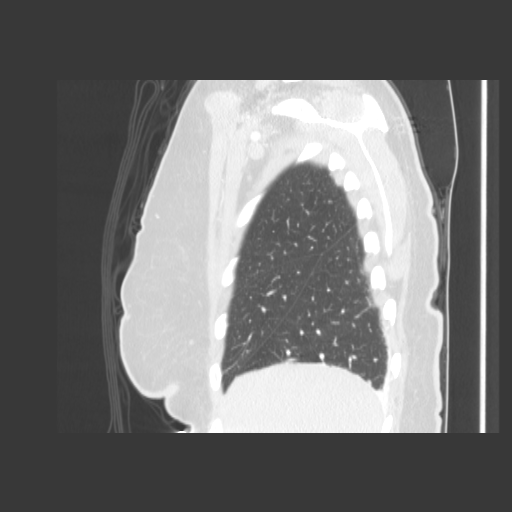
[im 55/145  lung]
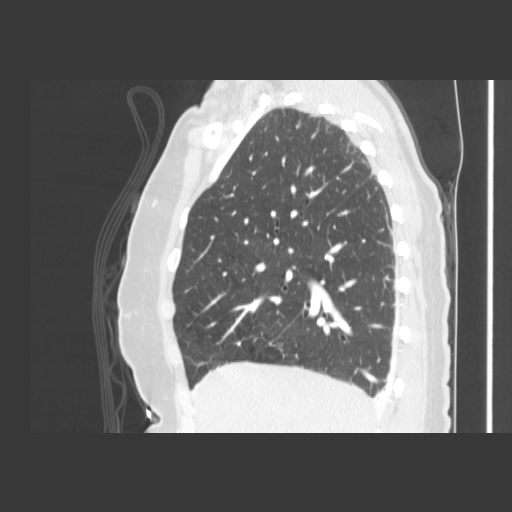
[im 73/145  lung]
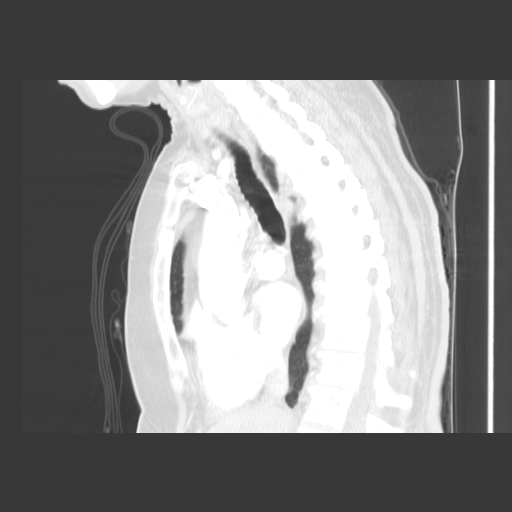
[im 91/145  lung]
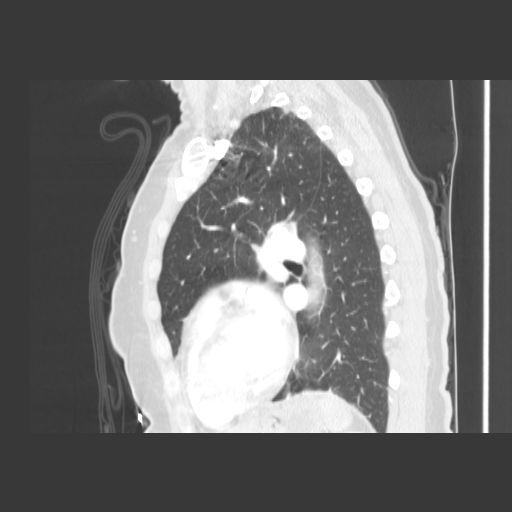
[im 109/145  lung]
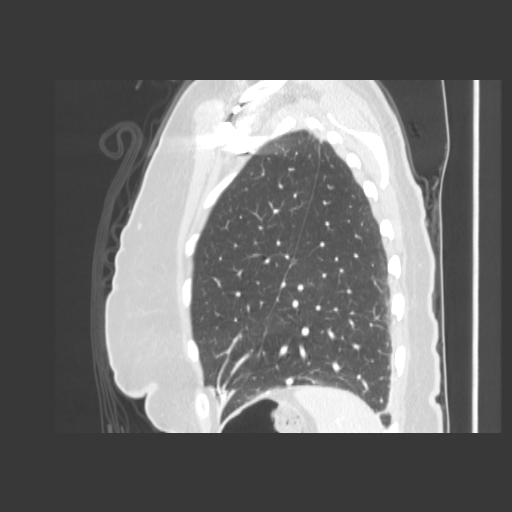

[17 of 36 positions shown; findings below may reference images not displayed]

FINDINGS: Mediastinum: Heterogeneous appearing thyroid gland.  There may be a
small sub centimeter hypoattenuating nodule in the posterior right
thyroid lobe which is nonspecific by CT.  No suspicious mediastinal
or hilar adenopathy.  Intrathoracic esophagus is unremarkable.

Heart/Vascular: The pulmonary arteries are well opacified to the
subsegmental level.  No evidence of central filling defect to
suggest acute pulmonary embolus.  No evidence of linear filling
defect, or webs to suggest chronic pulmonary emboli.  And central
pulmonary arteries are within normal limits for size.  The heart is
within normal limits for size.  No pericardial effusion.
Unremarkable pulmonary venous anatomy.  Conventional three-vessel
aortic arch with minimal noncalcified atherosclerotic vascular
disease.  No aneurysmal dilatation or evidence of dissection.

Lungs/Pleura:  Trace dependent atelectasis in the bilateral lower
lobes and inferior lingula.  Negative for pulmonary edema, or focal
airspace consolidation.  A 3 mm pulmonary nodules identified in the
left upper lobe (image 28, series 6).  Tiny 2 mm, likely calcified
pulmonary nodule more superiorly in the left upper lobe on image 22
of series 6, and a second tiny, likely calcified nodule in the
lingula on image 71 of series 6.  Mild biapical scarring.

Upper Abdomen: A 1.2 cm arterially enhancing lesion in the
posteromedial hepatic dome (segment seven) is incompletely
characterized. Otherwise, the visualized upper abdomen is
unremarkable.

Bones: No acute fracture or aggressive appearing lytic or blastic
osseous lesion. Multilevel degenerative disc disease most prominent
at T9-T10 where there is disc space narrowing and irregularity of
the endplates.
IMPRESSION: 1.  Negative for pulmonary embolus, pneumonia or other acute
cardiopulmonary process.

2. There are a few scattered 2 and 3 mm nodules in the left upper
lobe, several of which are likely calcified.  These are most
certainly the sequela of old granulomatous disease.  If the patient
is at high risk for bronchogenic carcinoma, follow-up chest CT at 1
year is recommended.  If the patient is at low risk, no follow-up
is needed.  This recommendation follows the consensus statement:
Guidelines for Management of Small Pulmonary Nodules Detected on CT
Scans:  A Statement from the [HOSPITAL] as published in

3.  Incompletely characterized 1.2 cm arterially enhancing lesion
in hepatic segment seven is incidentally imaged in the upper
abdomen.  Statistically, this is likely to represent a flash fill
hepatic hemangioma.  However, if the patient has a history of known
malignancy or cirrhosis, an underlying neoplasm could not be
excluded.  A triple phase CT scan of the abdomen could further
evaluate if clinically warranted.

4.  Mild dependent atelectasis.

## 2013-07-28 ENCOUNTER — Other Ambulatory Visit: Payer: Self-pay

## 2013-07-28 DIAGNOSIS — Z1231 Encounter for screening mammogram for malignant neoplasm of breast: Secondary | ICD-10-CM

## 2013-08-09 ENCOUNTER — Ambulatory Visit
Admission: RE | Admit: 2013-08-09 | Discharge: 2013-08-09 | Disposition: A | Discharge status: Home / Self Care | Payer: Medicare Other | Source: Ambulatory Visit

## 2013-08-09 ENCOUNTER — Encounter (INDEPENDENT_AMBULATORY_CARE_PROVIDER_SITE_OTHER): Payer: Self-pay

## 2013-08-09 DIAGNOSIS — Z1231 Encounter for screening mammogram for malignant neoplasm of breast: Secondary | ICD-10-CM

## 2014-07-12 ENCOUNTER — Other Ambulatory Visit: Payer: Self-pay

## 2014-07-12 DIAGNOSIS — Z1231 Encounter for screening mammogram for malignant neoplasm of breast: Secondary | ICD-10-CM

## 2014-08-11 ENCOUNTER — Ambulatory Visit: Payer: Self-pay

## 2014-08-15 ENCOUNTER — Ambulatory Visit
Admission: RE | Admit: 2014-08-15 | Discharge: 2014-08-15 | Disposition: A | Discharge status: Home / Self Care | Payer: Medicare Other | Source: Ambulatory Visit

## 2014-08-15 DIAGNOSIS — Z1231 Encounter for screening mammogram for malignant neoplasm of breast: Secondary | ICD-10-CM

## 2015-08-02 ENCOUNTER — Other Ambulatory Visit: Payer: Self-pay | Admitting: Internal Medicine

## 2015-08-02 DIAGNOSIS — Z1231 Encounter for screening mammogram for malignant neoplasm of breast: Secondary | ICD-10-CM

## 2015-08-16 ENCOUNTER — Ambulatory Visit
Admission: RE | Admit: 2015-08-16 | Discharge: 2015-08-16 | Disposition: A | Discharge status: Home / Self Care | Payer: Medicare Other | Source: Ambulatory Visit | Attending: Internal Medicine | Admitting: Internal Medicine

## 2015-08-16 DIAGNOSIS — Z1231 Encounter for screening mammogram for malignant neoplasm of breast: Secondary | ICD-10-CM

## 2015-08-21 ENCOUNTER — Ambulatory Visit
Admission: RE | Admit: 2015-08-21 | Discharge: 2015-08-21 | Disposition: A | Discharge status: Home / Self Care | Payer: Medicare Other | Source: Ambulatory Visit | Attending: Internal Medicine | Admitting: Internal Medicine

## 2015-08-21 ENCOUNTER — Other Ambulatory Visit: Payer: Self-pay | Admitting: Internal Medicine

## 2015-08-21 DIAGNOSIS — S6992XA Unspecified injury of left wrist, hand and finger(s), initial encounter: Secondary | ICD-10-CM

## 2015-11-30 ENCOUNTER — Encounter: Payer: Self-pay | Admitting: Internal Medicine

## 2015-12-03 ENCOUNTER — Other Ambulatory Visit: Payer: Self-pay | Admitting: Gastroenterology

## 2015-12-06 ENCOUNTER — Other Ambulatory Visit: Payer: Self-pay | Admitting: Gastroenterology

## 2016-01-07 ENCOUNTER — Encounter (HOSPITAL_COMMUNITY): Payer: Self-pay

## 2016-01-09 ENCOUNTER — Encounter (HOSPITAL_COMMUNITY): Payer: Self-pay

## 2016-01-14 ENCOUNTER — Ambulatory Visit (HOSPITAL_COMMUNITY): Payer: Medicare Other | Admitting: Anesthesiology

## 2016-01-14 ENCOUNTER — Encounter (HOSPITAL_COMMUNITY)
Admission: RE | Disposition: A | Discharge status: Home / Self Care | Payer: Self-pay | Source: Ambulatory Visit | Attending: Gastroenterology

## 2016-01-14 ENCOUNTER — Encounter (HOSPITAL_COMMUNITY): Payer: Self-pay | Admitting: *Deleted

## 2016-01-14 ENCOUNTER — Ambulatory Visit (HOSPITAL_COMMUNITY)
Admission: RE | Admit: 2016-01-14 | Discharge: 2016-01-14 | Disposition: A | Discharge status: Home / Self Care | Payer: Medicare Other | Source: Ambulatory Visit | Attending: Gastroenterology | Admitting: Gastroenterology

## 2016-01-14 DIAGNOSIS — M1990 Unspecified osteoarthritis, unspecified site: Secondary | ICD-10-CM | POA: Insufficient documentation

## 2016-01-14 DIAGNOSIS — Z1211 Encounter for screening for malignant neoplasm of colon: Secondary | ICD-10-CM | POA: Insufficient documentation

## 2016-01-14 DIAGNOSIS — F329 Major depressive disorder, single episode, unspecified: Secondary | ICD-10-CM | POA: Insufficient documentation

## 2016-01-14 DIAGNOSIS — Z86010 Personal history of colonic polyps: Secondary | ICD-10-CM | POA: Insufficient documentation

## 2016-01-14 HISTORY — PX: COLONOSCOPY WITH PROPOFOL: SHX5780

## 2016-01-14 SURGERY — COLONOSCOPY WITH PROPOFOL
Anesthesia: Monitor Anesthesia Care

## 2016-01-14 MED ORDER — PROPOFOL 10 MG/ML IV BOLUS
INTRAVENOUS | Status: AC
  Filled 2016-01-14: qty 20

## 2016-01-14 MED ORDER — PROPOFOL 500 MG/50ML IV EMUL
INTRAVENOUS | Status: DC | PRN
  Administered 2016-01-14: 150 ug/kg/min via INTRAVENOUS

## 2016-01-14 MED ORDER — SODIUM CHLORIDE 0.9 % IV SOLN: INTRAVENOUS | Status: DC

## 2016-01-14 MED ORDER — LACTATED RINGERS IV SOLN
INTRAVENOUS | Status: DC
  Administered 2016-01-14 (×2): via INTRAVENOUS

## 2016-01-14 MED ORDER — PROPOFOL 500 MG/50ML IV EMUL
INTRAVENOUS | Status: DC | PRN
  Administered 2016-01-14 (×2): 40 mg via INTRAVENOUS

## 2016-01-14 MED ORDER — PROPOFOL 10 MG/ML IV BOLUS
INTRAVENOUS | Status: AC
  Filled 2016-01-14: qty 40

## 2016-01-14 SURGICAL SUPPLY — 21 items

## 2016-01-14 NOTE — Transfer of Care (Signed)
Immediate Anesthesia Transfer of Care Note  Patient: Maria Lloyd  Procedure(s) Performed: Procedure(s): COLONOSCOPY WITH PROPOFOL (N/A)  Patient Location: PACU  Anesthesia Type:MAC  Level of Consciousness:  sedated, patient cooperative and responds to stimulation  Airway & Oxygen Therapy:Patient Spontanous Breathing and Patient connected to face mask oxgen  Post-op Assessment:  Report given to PACU RN and Post -op Vital signs reviewed and stable  Post vital signs:  Reviewed and stable  Last Vitals:  Vitals:   01/14/16 0941  BP: (!) 159/91  Pulse: 90  Resp: 10  Temp: 123XX123 C    Complications: No apparent anesthesia complications

## 2016-01-14 NOTE — Anesthesia Postprocedure Evaluation (Signed)
Anesthesia Post Note  Patient: Maria Lloyd  Procedure(s) Performed: Procedure(s) (LRB): COLONOSCOPY WITH PROPOFOL (N/A)  Patient location during evaluation: Endoscopy Anesthesia Type: MAC Level of consciousness: awake and alert, oriented and patient cooperative Pain management: pain level controlled Vital Signs Assessment: post-procedure vital signs reviewed and stable Respiratory status: spontaneous breathing, nonlabored ventilation and respiratory function stable Cardiovascular status: blood pressure returned to baseline and stable Postop Assessment: no signs of nausea or vomiting Anesthetic complications: no    Last Vitals:  Vitals:   01/14/16 0941 01/14/16 1110  BP: (!) 159/91 136/83  Pulse: 90   Resp: 10   Temp: 36.7 C     Last Pain:  Vitals:   01/14/16 0941  TempSrc: Oral                 Rodina Pinales,E. Mercedies Ganesh

## 2016-01-14 NOTE — H&P (Signed)
Procedure: Surveillance colonoscopy. 01/08/2011 colonoscopy was performed with removal of a diminutive ascending colon adenomatous polyp  History: The patient is a 70 year old female born November 28, 1945. She is scheduled to undergo a surveillance colonoscopy today.  Past medical history: Depression. Osteoarthritis. Total abdominal hysterectomy. Back surgery.  Exam: The patient is alert and lying comfortably on the endoscopy stretcher. Abdomen is soft and nontender to palpation. Lungs are clear to auscultation. Cardiac exam reveals a regular rhythm.  Plan: Proceed with surveillance colonoscopy

## 2016-01-14 NOTE — Anesthesia Preprocedure Evaluation (Addendum)
Anesthesia Evaluation  Patient identified by MRN, date of birth, ID band Patient awake    Reviewed: Allergy & Precautions, NPO status , Patient's Chart, lab work & pertinent test results  History of Anesthesia Complications Negative for: history of anesthetic complications  Airway Mallampati: II  TM Distance: >3 FB Neck ROM: Full    Dental  (+) Dental Advisory Given   Pulmonary neg pulmonary ROS,    breath sounds clear to auscultation       Cardiovascular (-) anginanegative cardio ROS   Rhythm:Regular Rate:Normal     Neuro/Psych Chronic back pain    GI/Hepatic negative GI ROS, Neg liver ROS,   Endo/Other  negative endocrine ROS  Renal/GU negative Renal ROS     Musculoskeletal  (+) Arthritis , Osteoarthritis,    Abdominal   Peds  Hematology negative hematology ROS (+)   Anesthesia Other Findings   Reproductive/Obstetrics                             Anesthesia Physical Anesthesia Plan  ASA: II  Anesthesia Plan: MAC   Post-op Pain Management:    Induction: Intravenous  Airway Management Planned: Natural Airway and Nasal Cannula  Additional Equipment:   Intra-op Plan:   Post-operative Plan:   Informed Consent: I have reviewed the patients History and Physical, chart, labs and discussed the procedure including the risks, benefits and alternatives for the proposed anesthesia with the patient or authorized representative who has indicated his/her understanding and acceptance.   Dental advisory given  Plan Discussed with: CRNA and Surgeon  Anesthesia Plan Comments: (Plan routine monitors, MAC)        Anesthesia Quick Evaluation

## 2016-01-14 NOTE — Op Note (Signed)
[facility] Patient Name: Maria Lloyd Procedure Date: 01/14/2016 MRN: [id number] Attending MD: Garlan Fair , MD Date of Birth: 09-Jun-1945 CSN: [id number] Age: 70 Admit Type: Outpatient Procedure:                Colonoscopy Indications:              High risk colon cancer surveillance: Personal                            history of non-advanced adenoma Providers:                Garlan Fair, MD, Carolynn Comment, RN, Ralene Bathe, Technician, Arnoldo Hooker, CRNA Referring MD:              Medicines:                Propofol per Anesthesia Complications:            No immediate complications. Estimated Blood Loss:     Estimated blood loss: none. Procedure:                Pre-Anesthesia Assessment:                           - Prior to the procedure, a History and Physical                            was performed, and patient medications and                            allergies were reviewed. The patient's tolerance of                            previous anesthesia was also reviewed. The risks                            and benefits of the procedure and the sedation                            options and risks were discussed with the patient.                            All questions were answered, and informed consent                            was obtained. Prior Anticoagulants: The patient has                            taken no previous anticoagulant or antiplatelet                            agents. ASA Grade Assessment: II - A patient with  mild systemic disease. After reviewing the risks                            and benefits, the patient was deemed in                            satisfactory condition to undergo the procedure.                           After obtaining informed consent, the colonoscope                            was passed under direct vision. Throughout the                             procedure, the patient's blood pressure, pulse, and                            oxygen saturations were monitored continuously. The                            [id number] scope was introduced through                            the anus and advanced to the the cecum, identified                            by appendiceal orifice and ileocecal valve. The                            colonoscopy was somewhat difficult due to                            significant looping. The patient tolerated the                            procedure well. The quality of the bowel                            preparation was good. The appendiceal orifice and                            the rectum were photographed. Scope In: 10:14:45 AM Scope Out: 10:30:59 AM Scope Withdrawal Time: 0 hours 7 minutes 19 seconds  Total Procedure Duration: 0 hours 16 minutes 14 seconds  Findings:      The perianal and digital rectal examinations were normal.      The entire examined colon appeared normal. Impression:               - The entire examined colon is normal.                           - No specimens collected. Moderate Sedation:      N/A- Per Anesthesia Care Recommendation:           -  Patient has a contact number available for                            emergencies. The signs and symptoms of potential                            delayed complications were discussed with the                            patient. Return to normal activities tomorrow.                            Written discharge instructions were provided to the                            patient.                           - Repeat colonoscopy in 10 years for surveillance.                           - Resume previous diet.                           - Continue present medications. Procedure Code(s):        --- Professional ---                           [id number], Colorectal cancer screening; colonoscopy on                            individual at high  risk Diagnosis Code(s):        --- Professional ---                           Z86.010, Personal history of colonic polyps CPT copyright 2016 American [employer] Association. All rights reserved. The codes documented in this report are preliminary and upon coder review may  be revised to meet current compliance requirements. Earle Gell, MD Garlan Fair, MD 01/14/2016 10:38:28 AM This report has been signed electronically. Number of Addenda: 0

## 2016-01-14 NOTE — Discharge Instructions (Signed)

## 2016-01-15 ENCOUNTER — Encounter (HOSPITAL_COMMUNITY): Payer: Self-pay | Admitting: Gastroenterology

## 2016-10-02 ENCOUNTER — Other Ambulatory Visit: Payer: Self-pay | Admitting: Internal Medicine

## 2016-10-02 DIAGNOSIS — Z1231 Encounter for screening mammogram for malignant neoplasm of breast: Secondary | ICD-10-CM

## 2016-10-13 ENCOUNTER — Ambulatory Visit
Admission: RE | Admit: 2016-10-13 | Discharge: 2016-10-13 | Disposition: A | Discharge status: Home / Self Care | Payer: Medicare Other | Source: Ambulatory Visit | Attending: Internal Medicine | Admitting: Internal Medicine

## 2016-10-13 DIAGNOSIS — Z1231 Encounter for screening mammogram for malignant neoplasm of breast: Secondary | ICD-10-CM

## 2017-03-09 ENCOUNTER — Other Ambulatory Visit: Payer: Self-pay

## 2017-09-23 ENCOUNTER — Other Ambulatory Visit: Payer: Self-pay | Admitting: Internal Medicine

## 2017-09-23 DIAGNOSIS — Z1231 Encounter for screening mammogram for malignant neoplasm of breast: Secondary | ICD-10-CM

## 2017-10-23 ENCOUNTER — Ambulatory Visit
Admission: RE | Admit: 2017-10-23 | Discharge: 2017-10-23 | Disposition: A | Discharge status: Home / Self Care | Payer: Medicare Other | Source: Ambulatory Visit | Attending: Internal Medicine | Admitting: Internal Medicine

## 2017-10-23 DIAGNOSIS — Z1231 Encounter for screening mammogram for malignant neoplasm of breast: Secondary | ICD-10-CM

## 2018-09-29 ENCOUNTER — Other Ambulatory Visit: Payer: Self-pay | Admitting: Internal Medicine

## 2018-09-29 DIAGNOSIS — Z1231 Encounter for screening mammogram for malignant neoplasm of breast: Secondary | ICD-10-CM

## 2018-11-15 ENCOUNTER — Other Ambulatory Visit: Payer: Self-pay

## 2018-11-15 ENCOUNTER — Ambulatory Visit
Admission: RE | Admit: 2018-11-15 | Discharge: 2018-11-15 | Disposition: A | Discharge status: Home / Self Care | Payer: Medicare Other | Source: Ambulatory Visit | Attending: Internal Medicine | Admitting: Internal Medicine

## 2018-11-15 DIAGNOSIS — Z1231 Encounter for screening mammogram for malignant neoplasm of breast: Secondary | ICD-10-CM

## 2019-02-23 ENCOUNTER — Other Ambulatory Visit: Payer: Self-pay | Admitting: Internal Medicine

## 2019-02-23 DIAGNOSIS — M85859 Other specified disorders of bone density and structure, unspecified thigh: Secondary | ICD-10-CM

## 2019-03-13 ENCOUNTER — Ambulatory Visit: Payer: Medicare Other | Attending: Internal Medicine

## 2019-03-13 DIAGNOSIS — Z23 Encounter for immunization: Secondary | ICD-10-CM | POA: Insufficient documentation

## 2019-03-13 NOTE — Progress Notes (Signed)
   Covid-19 Vaccination Clinic  Name:  Maria Lloyd    MRN: [id number] DOB: [birth year]  03/13/2019  [name] was observed post Covid-19 immunization for 15 minutes without incidence. She was provided with Vaccine Information Sheet and instruction to access the V-Safe system.   [name] was instructed to call 911 with any severe reactions post vaccine: Marland Kitchen Difficulty breathing  . Swelling of your face and throat  . A fast heartbeat  . A bad rash all over your body  . Dizziness and weakness    Immunizations Administered    Name Date Dose VIS Date Route   Pfizer COVID-19 Vaccine 03/13/2019 12:04 PM 0.3 mL 01/07/2019 Intramuscular   Manufacturer: [place]   Lot: [id number]   [place]: [id number]

## 2019-04-05 ENCOUNTER — Ambulatory Visit: Payer: Medicare Other | Attending: Internal Medicine

## 2019-04-05 DIAGNOSIS — Z23 Encounter for immunization: Secondary | ICD-10-CM | POA: Insufficient documentation

## 2019-04-05 NOTE — Progress Notes (Signed)
   Covid-19 Vaccination Clinic  Name:  Maria Lloyd    MRN: [id number] DOB: [birth year]  04/05/2019  [name] was observed post Covid-19 immunization for 15 minutes without incident. She was provided with Vaccine Information Sheet and instruction to access the V-Safe system.   [name] was instructed to call 911 with any severe reactions post vaccine: Marland Kitchen Difficulty breathing  . Swelling of face and throat  . A fast heartbeat  . A bad rash all over body  . Dizziness and weakness   Immunizations Administered    Name Date Dose VIS Date Route   Pfizer COVID-19 Vaccine 04/05/2019  4:32 PM 0.3 mL 01/07/2019 Intramuscular   Manufacturer: [place]   Lot: [id number]   [place]: [id number]

## 2019-05-09 ENCOUNTER — Other Ambulatory Visit: Payer: Self-pay

## 2019-05-09 ENCOUNTER — Ambulatory Visit
Admission: RE | Admit: 2019-05-09 | Discharge: 2019-05-09 | Disposition: A | Discharge status: Home / Self Care | Payer: Medicare Other | Source: Ambulatory Visit | Attending: Internal Medicine | Admitting: Internal Medicine

## 2019-05-09 DIAGNOSIS — M85859 Other specified disorders of bone density and structure, unspecified thigh: Secondary | ICD-10-CM

## 2019-10-19 ENCOUNTER — Other Ambulatory Visit: Payer: Self-pay | Admitting: Internal Medicine

## 2019-10-19 DIAGNOSIS — Z1231 Encounter for screening mammogram for malignant neoplasm of breast: Secondary | ICD-10-CM

## 2019-10-24 ENCOUNTER — Encounter: Payer: Self-pay | Admitting: Podiatry

## 2019-10-24 ENCOUNTER — Other Ambulatory Visit: Payer: Self-pay

## 2019-10-24 ENCOUNTER — Ambulatory Visit (INDEPENDENT_AMBULATORY_CARE_PROVIDER_SITE_OTHER): Payer: Medicare Other | Admitting: Podiatry

## 2019-10-24 DIAGNOSIS — Q828 Other specified congenital malformations of skin: Secondary | ICD-10-CM | POA: Diagnosis not present

## 2019-10-24 DIAGNOSIS — L600 Ingrowing nail: Secondary | ICD-10-CM

## 2019-10-24 MED ORDER — NEOMYCIN-POLYMYXIN-HC 3.5-10000-1 OT SOLN: OTIC | 1 refills | Status: AC

## 2019-10-24 NOTE — Patient Instructions (Signed)
Place 1/4 cup of epsom salts in a quart of warm tap water.  Submerge your foot or feet in the solution and soak for 20 minutes.  This soak should be done twice a day.  Next, remove your foot or feet from solution, blot dry the affected area. Apply ointment and cover if instructed by your doctor.   IF YOUR SKIN BECOMES IRRITATED WHILE USING THESE INSTRUCTIONS, IT IS OKAY TO SWITCH TO  WHITE VINEGAR AND WATER.  As another alternative soak, you may use antibacterial soap and water.  Monitor for any signs/symptoms of infection. Call the office immediately if any occur or go directly to the emergency room. Call with any questions/concerns.  Ingrown Toenail An ingrown toenail occurs when the corner or sides of a toenail grow into the surrounding skin. This causes discomfort and pain. The big toe is most commonly affected, but any of the toes can be affected. If an ingrown toenail is not treated, it can become infected. What are the causes? This condition may be caused by:  Wearing shoes that are too small or tight.  An injury, such as stubbing your toe or having your toe stepped on.  Improper cutting or care of your toenails.  Having nail or foot abnormalities that were present from birth (congenital abnormalities), such as having a nail that is too big for your toe. What increases the risk? The following factors may make you more likely to develop ingrown toenails:  Age. Nails tend to get thicker with age, so ingrown nails are more common among older people.  Cutting your toenails incorrectly, such as cutting them very short or cutting them unevenly. An ingrown toenail is more likely to get infected if you have:  Diabetes.  Blood flow (circulation) problems. What are the signs or symptoms? Symptoms of an ingrown toenail may include:  Pain, soreness, or tenderness.  Redness.  Swelling.  Hardening of the skin that surrounds the toenail. Signs that an ingrown toenail may be infected  include:  Fluid or pus.  Symptoms that get worse instead of better. How is this diagnosed? An ingrown toenail may be diagnosed based on your medical history, your symptoms, and a physical exam. If you have fluid or blood coming from your toenail, a sample may be collected to test for the specific type of bacteria that is causing the infection. How is this treated? Treatment depends on how severe your ingrown toenail is. You may be able to care for your toenail at home.  If you have an infection, you may be prescribed antibiotic medicines.  If you have fluid or pus draining from your toenail, your health care provider may drain it.  If you have trouble walking, you may be given crutches to use.  If you have a severe or infected ingrown toenail, you may need a procedure to remove part or all of the nail. Follow these instructions at home: Foot care   Do not pick at your toenail or try to remove it yourself.  Soak your foot in warm, soapy water. Do this for 20 minutes, 3 times a day, or as often as told by your health care provider. This helps to keep your toe clean and keep your skin soft.  Wear shoes that fit well and are not too tight. Your health care provider may recommend that you wear open-toed shoes while you heal.  Trim your toenails regularly and carefully. Cut your toenails straight across to prevent injury to the skin at the   corners of the toenail. Do not cut your nails in a curved shape.  Keep your feet clean and dry to help prevent infection. Medicines  Take over-the-counter and prescription medicines only as told by your health care provider.  If you were prescribed an antibiotic, take it as told by your health care provider. Do not stop taking the antibiotic even if you start to feel better. Activity  Return to your normal activities as told by your health care provider. Ask your health care provider what activities are safe for you.  Avoid activities that cause  pain. General instructions  If your health care provider told you to use crutches to help you move around, use them as instructed.  Keep all follow-up visits as told by your health care provider. This is important. Contact a health care provider if:  You have more redness, swelling, pain, or other symptoms that do not improve with treatment.  You have fluid, blood, or pus coming from your toenail. Get help right away if:  You have a red streak on your skin that starts at your foot and spreads up your leg.  You have a fever. Summary  An ingrown toenail occurs when the corner or sides of a toenail grow into the surrounding skin. This causes discomfort and pain. The big toe is most commonly affected, but any of the toes can be affected.  If an ingrown toenail is not treated, it can become infected.  Fluid or pus draining from your toenail is a sign of infection. Your health care provider may need to drain it. You may be given antibiotics to treat the infection.  Trimming your toenails regularly and properly can help you prevent an ingrown toenail. This information is not intended to replace advice given to you by your health care provider. Make sure you discuss any questions you have with your health care provider. Document Revised: 05/07/2018 Document Reviewed: 10/01/2016 [employer] Patient Education  2020 [employer]  

## 2019-10-24 NOTE — Progress Notes (Signed)
Subjective:   Patient ID: Maria Lloyd, female   DOB: 74 y.o.   MRN: [id number]   HPI Patient presents with 2 separate problems with 1 being a severely thickened right hallux nail that is dystrophic and secondarily lesions on the bottom of the right foot that are painful and she states been present for around 6 months.  Patient does not smoke likes to be active and states the nail has been this way for 3 or 4 years   Review of Systems  All other systems reviewed and are negative.       Objective:  Physical Exam Vitals and nursing note reviewed.  Constitutional:      Appearance: She is well-developed.  Pulmonary:     Effort: Pulmonary effort is normal.  Musculoskeletal:        General: Normal range of motion.  Skin:    General: Skin is warm.  Neurological:     Mental Status: She is alert.     Neurovascular status intact muscle strength found to be adequate range of motion within normal limits.  Patient is found to have a severely thickened deformed right big toenail that is loose painful when pressed and make shoe gear difficult and is found to have a lesion underneath the third and fifth metatarsals right foot that have a lucent core and are painful when pressed.  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  I infiltrated the right hallux 60 mg like Marcaine mixture sterile prep done and using sterile instrumentation remove the hallux nail exposed matrix applied phenol 5 applications 30 seconds followed by alcohol lavage sterile dressing and gave instructions for soaks.  Leave dressing on 24 hours but take it off earlier if any throbbing were to occur and also encouraged to call with  questions.  Sharp sterile debridement of 2 separate lesions plantar right accomplished with no iatrogenic bleeding and patient will be seen back as needed

## 2019-11-17 ENCOUNTER — Ambulatory Visit
Admission: RE | Admit: 2019-11-17 | Discharge: 2019-11-17 | Disposition: A | Discharge status: Home / Self Care | Payer: Medicare Other | Source: Ambulatory Visit | Attending: Internal Medicine | Admitting: Internal Medicine

## 2019-11-17 ENCOUNTER — Other Ambulatory Visit: Payer: Self-pay

## 2019-11-17 DIAGNOSIS — Z1231 Encounter for screening mammogram for malignant neoplasm of breast: Secondary | ICD-10-CM

## 2020-05-01 DIAGNOSIS — Z1389 Encounter for screening for other disorder: Secondary | ICD-10-CM | POA: Diagnosis not present

## 2020-05-01 DIAGNOSIS — M8580 Other specified disorders of bone density and structure, unspecified site: Secondary | ICD-10-CM | POA: Diagnosis not present

## 2020-05-01 DIAGNOSIS — E559 Vitamin D deficiency, unspecified: Secondary | ICD-10-CM | POA: Diagnosis not present

## 2020-05-01 DIAGNOSIS — E785 Hyperlipidemia, unspecified: Secondary | ICD-10-CM | POA: Diagnosis not present

## 2020-05-01 DIAGNOSIS — R5383 Other fatigue: Secondary | ICD-10-CM | POA: Diagnosis not present

## 2020-05-01 DIAGNOSIS — Z0000 Encounter for general adult medical examination without abnormal findings: Secondary | ICD-10-CM | POA: Diagnosis not present

## 2020-05-01 DIAGNOSIS — J309 Allergic rhinitis, unspecified: Secondary | ICD-10-CM | POA: Diagnosis not present

## 2020-07-19 DIAGNOSIS — L2389 Allergic contact dermatitis due to other agents: Secondary | ICD-10-CM | POA: Diagnosis not present

## 2020-09-25 DIAGNOSIS — H2513 Age-related nuclear cataract, bilateral: Secondary | ICD-10-CM | POA: Diagnosis not present

## 2020-09-25 DIAGNOSIS — H5203 Hypermetropia, bilateral: Secondary | ICD-10-CM | POA: Diagnosis not present

## 2020-10-12 ENCOUNTER — Other Ambulatory Visit: Payer: Self-pay | Admitting: Internal Medicine

## 2020-10-12 DIAGNOSIS — Z1231 Encounter for screening mammogram for malignant neoplasm of breast: Secondary | ICD-10-CM

## 2020-11-20 ENCOUNTER — Ambulatory Visit: Payer: Medicare Other

## 2020-11-20 DIAGNOSIS — B370 Candidal stomatitis: Secondary | ICD-10-CM | POA: Diagnosis not present

## 2020-12-04 ENCOUNTER — Ambulatory Visit
Admission: RE | Admit: 2020-12-04 | Discharge: 2020-12-04 | Disposition: A | Discharge status: Home / Self Care | Payer: Medicare Other | Source: Ambulatory Visit | Attending: Internal Medicine | Admitting: Internal Medicine

## 2020-12-04 DIAGNOSIS — Z1231 Encounter for screening mammogram for malignant neoplasm of breast: Secondary | ICD-10-CM | POA: Diagnosis not present

## 2021-05-06 ENCOUNTER — Other Ambulatory Visit: Payer: Self-pay | Admitting: Internal Medicine

## 2021-05-06 DIAGNOSIS — D649 Anemia, unspecified: Secondary | ICD-10-CM | POA: Diagnosis not present

## 2021-05-06 DIAGNOSIS — M8580 Other specified disorders of bone density and structure, unspecified site: Secondary | ICD-10-CM | POA: Diagnosis not present

## 2021-05-06 DIAGNOSIS — M519 Unspecified thoracic, thoracolumbar and lumbosacral intervertebral disc disorder: Secondary | ICD-10-CM | POA: Diagnosis not present

## 2021-05-06 DIAGNOSIS — Z1389 Encounter for screening for other disorder: Secondary | ICD-10-CM | POA: Diagnosis not present

## 2021-05-06 DIAGNOSIS — E559 Vitamin D deficiency, unspecified: Secondary | ICD-10-CM | POA: Diagnosis not present

## 2021-05-06 DIAGNOSIS — E785 Hyperlipidemia, unspecified: Secondary | ICD-10-CM | POA: Diagnosis not present

## 2021-05-06 DIAGNOSIS — Z0000 Encounter for general adult medical examination without abnormal findings: Secondary | ICD-10-CM | POA: Diagnosis not present

## 2021-05-06 DIAGNOSIS — R269 Unspecified abnormalities of gait and mobility: Secondary | ICD-10-CM | POA: Diagnosis not present

## 2021-05-06 DIAGNOSIS — M1990 Unspecified osteoarthritis, unspecified site: Secondary | ICD-10-CM | POA: Diagnosis not present

## 2021-05-06 DIAGNOSIS — M549 Dorsalgia, unspecified: Secondary | ICD-10-CM | POA: Diagnosis not present

## 2021-05-24 DIAGNOSIS — R2689 Other abnormalities of gait and mobility: Secondary | ICD-10-CM | POA: Diagnosis not present

## 2021-05-24 DIAGNOSIS — M5459 Other low back pain: Secondary | ICD-10-CM | POA: Diagnosis not present

## 2021-06-13 DIAGNOSIS — M545 Low back pain, unspecified: Secondary | ICD-10-CM | POA: Diagnosis not present

## 2021-10-22 ENCOUNTER — Other Ambulatory Visit: Payer: Self-pay | Admitting: Internal Medicine

## 2021-10-22 DIAGNOSIS — Z1231 Encounter for screening mammogram for malignant neoplasm of breast: Secondary | ICD-10-CM

## 2021-10-29 ENCOUNTER — Ambulatory Visit
Admission: RE | Admit: 2021-10-29 | Discharge: 2021-10-29 | Disposition: A | Discharge status: Home / Self Care | Payer: Medicare Other | Source: Ambulatory Visit | Attending: Internal Medicine | Admitting: Internal Medicine

## 2021-10-29 DIAGNOSIS — M8589 Other specified disorders of bone density and structure, multiple sites: Secondary | ICD-10-CM | POA: Diagnosis not present

## 2021-10-29 DIAGNOSIS — Z780 Asymptomatic menopausal state: Secondary | ICD-10-CM | POA: Diagnosis not present

## 2021-10-29 DIAGNOSIS — M8580 Other specified disorders of bone density and structure, unspecified site: Secondary | ICD-10-CM

## 2021-10-31 DIAGNOSIS — H53143 Visual discomfort, bilateral: Secondary | ICD-10-CM | POA: Diagnosis not present

## 2021-12-05 ENCOUNTER — Ambulatory Visit
Admission: RE | Admit: 2021-12-05 | Discharge: 2021-12-05 | Disposition: A | Discharge status: Home / Self Care | Payer: Medicare Other | Source: Ambulatory Visit | Attending: Internal Medicine | Admitting: Internal Medicine

## 2021-12-05 DIAGNOSIS — Z1231 Encounter for screening mammogram for malignant neoplasm of breast: Secondary | ICD-10-CM | POA: Diagnosis not present

## 2022-05-08 DIAGNOSIS — E785 Hyperlipidemia, unspecified: Secondary | ICD-10-CM | POA: Diagnosis not present

## 2022-05-08 DIAGNOSIS — R6883 Chills (without fever): Secondary | ICD-10-CM | POA: Diagnosis not present

## 2022-05-08 DIAGNOSIS — E559 Vitamin D deficiency, unspecified: Secondary | ICD-10-CM | POA: Diagnosis not present

## 2022-05-08 DIAGNOSIS — J309 Allergic rhinitis, unspecified: Secondary | ICD-10-CM | POA: Diagnosis not present

## 2022-05-08 DIAGNOSIS — Z0000 Encounter for general adult medical examination without abnormal findings: Secondary | ICD-10-CM | POA: Diagnosis not present

## 2022-05-08 DIAGNOSIS — R030 Elevated blood-pressure reading, without diagnosis of hypertension: Secondary | ICD-10-CM | POA: Diagnosis not present

## 2022-05-08 DIAGNOSIS — M8580 Other specified disorders of bone density and structure, unspecified site: Secondary | ICD-10-CM | POA: Diagnosis not present

## 2022-05-08 DIAGNOSIS — M519 Unspecified thoracic, thoracolumbar and lumbosacral intervertebral disc disorder: Secondary | ICD-10-CM | POA: Diagnosis not present

## 2022-06-20 DIAGNOSIS — R030 Elevated blood-pressure reading, without diagnosis of hypertension: Secondary | ICD-10-CM | POA: Diagnosis not present

## 2022-10-16 ENCOUNTER — Other Ambulatory Visit: Payer: Self-pay | Admitting: Internal Medicine

## 2022-10-16 DIAGNOSIS — Z1231 Encounter for screening mammogram for malignant neoplasm of breast: Secondary | ICD-10-CM

## 2022-12-08 ENCOUNTER — Ambulatory Visit
Admission: RE | Admit: 2022-12-08 | Discharge: 2022-12-08 | Disposition: A | Discharge status: Home / Self Care | Payer: Medicare Other | Source: Ambulatory Visit | Attending: Internal Medicine | Admitting: Internal Medicine

## 2022-12-08 DIAGNOSIS — Z1231 Encounter for screening mammogram for malignant neoplasm of breast: Secondary | ICD-10-CM | POA: Diagnosis not present

## 2022-12-16 DIAGNOSIS — H43391 Other vitreous opacities, right eye: Secondary | ICD-10-CM | POA: Diagnosis not present

## 2022-12-16 DIAGNOSIS — H2513 Age-related nuclear cataract, bilateral: Secondary | ICD-10-CM | POA: Diagnosis not present

## 2022-12-16 DIAGNOSIS — H53143 Visual discomfort, bilateral: Secondary | ICD-10-CM | POA: Diagnosis not present

## 2022-12-16 DIAGNOSIS — H524 Presbyopia: Secondary | ICD-10-CM | POA: Diagnosis not present

## 2022-12-16 DIAGNOSIS — H52223 Regular astigmatism, bilateral: Secondary | ICD-10-CM | POA: Diagnosis not present

## 2022-12-16 DIAGNOSIS — H1789 Other corneal scars and opacities: Secondary | ICD-10-CM | POA: Diagnosis not present

## 2023-02-06 ENCOUNTER — Other Ambulatory Visit: Payer: Self-pay | Admitting: Medical Genetics

## 2023-03-02 ENCOUNTER — Other Ambulatory Visit (HOSPITAL_COMMUNITY)
Admission: RE | Admit: 2023-03-02 | Discharge: 2023-03-02 | Disposition: A | Discharge status: Home / Self Care | Payer: Self-pay | Source: Ambulatory Visit | Attending: Medical Genetics | Admitting: Medical Genetics

## 2023-03-15 LAB — GENECONNECT MOLECULAR SCREEN: Genetic Analysis Overall Interpretation: NEGATIVE

## 2023-05-12 DIAGNOSIS — E559 Vitamin D deficiency, unspecified: Secondary | ICD-10-CM | POA: Diagnosis not present

## 2023-05-12 DIAGNOSIS — M8580 Other specified disorders of bone density and structure, unspecified site: Secondary | ICD-10-CM | POA: Diagnosis not present

## 2023-05-12 DIAGNOSIS — R202 Paresthesia of skin: Secondary | ICD-10-CM | POA: Diagnosis not present

## 2023-05-12 DIAGNOSIS — Z23 Encounter for immunization: Secondary | ICD-10-CM | POA: Diagnosis not present

## 2023-05-12 DIAGNOSIS — Z0000 Encounter for general adult medical examination without abnormal findings: Secondary | ICD-10-CM | POA: Diagnosis not present

## 2023-05-12 DIAGNOSIS — J309 Allergic rhinitis, unspecified: Secondary | ICD-10-CM | POA: Diagnosis not present

## 2023-05-12 DIAGNOSIS — M1990 Unspecified osteoarthritis, unspecified site: Secondary | ICD-10-CM | POA: Diagnosis not present

## 2023-05-12 DIAGNOSIS — E785 Hyperlipidemia, unspecified: Secondary | ICD-10-CM | POA: Diagnosis not present

## 2023-07-09 DIAGNOSIS — L603 Nail dystrophy: Secondary | ICD-10-CM | POA: Diagnosis not present

## 2023-07-09 DIAGNOSIS — L820 Inflamed seborrheic keratosis: Secondary | ICD-10-CM | POA: Diagnosis not present

## 2023-07-09 DIAGNOSIS — L538 Other specified erythematous conditions: Secondary | ICD-10-CM | POA: Diagnosis not present

## 2023-07-09 DIAGNOSIS — L578 Other skin changes due to chronic exposure to nonionizing radiation: Secondary | ICD-10-CM | POA: Diagnosis not present

## 2023-07-09 DIAGNOSIS — L814 Other melanin hyperpigmentation: Secondary | ICD-10-CM | POA: Diagnosis not present

## 2023-07-09 DIAGNOSIS — D225 Melanocytic nevi of trunk: Secondary | ICD-10-CM | POA: Diagnosis not present

## 2023-07-09 DIAGNOSIS — L821 Other seborrheic keratosis: Secondary | ICD-10-CM | POA: Diagnosis not present

## 2023-07-09 DIAGNOSIS — L309 Dermatitis, unspecified: Secondary | ICD-10-CM | POA: Diagnosis not present

## 2023-08-20 ENCOUNTER — Other Ambulatory Visit: Payer: Self-pay | Admitting: Internal Medicine

## 2023-08-20 DIAGNOSIS — Z1231 Encounter for screening mammogram for malignant neoplasm of breast: Secondary | ICD-10-CM

## 2023-08-23 ENCOUNTER — Other Ambulatory Visit (HOSPITAL_BASED_OUTPATIENT_CLINIC_OR_DEPARTMENT_OTHER): Payer: Self-pay | Admitting: Internal Medicine

## 2023-08-23 DIAGNOSIS — M8580 Other specified disorders of bone density and structure, unspecified site: Secondary | ICD-10-CM

## 2023-12-09 ENCOUNTER — Ambulatory Visit
Admission: RE | Admit: 2023-12-09 | Discharge: 2023-12-09 | Disposition: A | Discharge status: Home / Self Care | Source: Ambulatory Visit | Attending: Internal Medicine | Admitting: Internal Medicine

## 2023-12-09 DIAGNOSIS — Z1231 Encounter for screening mammogram for malignant neoplasm of breast: Secondary | ICD-10-CM

## 2024-03-30 ENCOUNTER — Other Ambulatory Visit (HOSPITAL_BASED_OUTPATIENT_CLINIC_OR_DEPARTMENT_OTHER)
